# Patient Record
Sex: Female | Born: 1965 | Hispanic: No | Marital: Married | Smoking: Former smoker
Health system: Southern US, Community
[De-identification: ages and names within clinical notes are randomized; demographics above are authoritative.]

## PROBLEM LIST (undated history)

## (undated) DIAGNOSIS — R011 Cardiac murmur, unspecified: Secondary | ICD-10-CM

## (undated) DIAGNOSIS — E785 Hyperlipidemia, unspecified: Secondary | ICD-10-CM

## (undated) HISTORY — DX: Cardiac murmur, unspecified: R01.1

## (undated) HISTORY — DX: Hyperlipidemia, unspecified: E78.5

---

## 2004-05-07 HISTORY — PX: TUBAL LIGATION: SHX77

## 2004-06-28 ENCOUNTER — Inpatient Hospital Stay (HOSPITAL_COMMUNITY): Admission: RE | Admit: 2004-06-28 | Discharge: 2004-07-01 | Payer: Self-pay | Admitting: Gynecology

## 2004-06-28 ENCOUNTER — Encounter (INDEPENDENT_AMBULATORY_CARE_PROVIDER_SITE_OTHER): Payer: Self-pay | Admitting: Specialist

## 2004-08-04 ENCOUNTER — Other Ambulatory Visit: Admission: RE | Admit: 2004-08-04 | Discharge: 2004-08-04 | Payer: Self-pay | Admitting: Gynecology

## 2005-09-05 ENCOUNTER — Other Ambulatory Visit: Admission: RE | Admit: 2005-09-05 | Discharge: 2005-09-05 | Payer: Self-pay | Admitting: Gynecology

## 2006-10-02 ENCOUNTER — Other Ambulatory Visit: Admission: RE | Admit: 2006-10-02 | Discharge: 2006-10-02 | Payer: Self-pay | Admitting: Gynecology

## 2007-11-04 ENCOUNTER — Other Ambulatory Visit: Admission: RE | Admit: 2007-11-04 | Discharge: 2007-11-04 | Payer: Self-pay | Admitting: Gynecology

## 2007-12-12 ENCOUNTER — Encounter: Admission: RE | Admit: 2007-12-12 | Discharge: 2007-12-12 | Payer: Self-pay | Admitting: Gynecology

## 2009-01-18 ENCOUNTER — Other Ambulatory Visit: Admission: RE | Admit: 2009-01-18 | Discharge: 2009-01-18 | Payer: Self-pay | Admitting: Gynecology

## 2009-01-18 ENCOUNTER — Encounter: Payer: Self-pay | Admitting: Gynecology

## 2009-01-18 ENCOUNTER — Ambulatory Visit: Payer: Self-pay | Admitting: Gynecology

## 2010-05-18 ENCOUNTER — Ambulatory Visit
Admission: RE | Admit: 2010-05-18 | Discharge: 2010-05-18 | Payer: Self-pay | Source: Home / Self Care | Attending: Women's Health | Admitting: Women's Health

## 2010-05-18 ENCOUNTER — Other Ambulatory Visit
Admission: RE | Admit: 2010-05-18 | Discharge: 2010-05-18 | Payer: Self-pay | Source: Home / Self Care | Admitting: Gynecology

## 2010-06-22 ENCOUNTER — Other Ambulatory Visit: Payer: Self-pay | Admitting: Gynecology

## 2010-06-22 DIAGNOSIS — Z1231 Encounter for screening mammogram for malignant neoplasm of breast: Secondary | ICD-10-CM

## 2010-06-30 ENCOUNTER — Ambulatory Visit
Admission: RE | Admit: 2010-06-30 | Discharge: 2010-06-30 | Disposition: A | Discharge status: Home / Self Care | Payer: BC Managed Care – PPO | Source: Ambulatory Visit | Attending: Gynecology | Admitting: Gynecology

## 2010-06-30 DIAGNOSIS — Z1231 Encounter for screening mammogram for malignant neoplasm of breast: Secondary | ICD-10-CM

## 2010-09-22 NOTE — H&P (Signed)
[name], [name]             ACCOUNT NO.:  [id number]   MEDICAL RECORD NO.:  [id number]          PATIENT TYPE:  INP   LOCATION:  NA                            FACILITY:  [facility]   PHYSICIAN:  Timothy P. Fontaine, M.D.DATE OF BIRTH:  1965-06-18   DATE OF ADMISSION:  06/28/2004  DATE OF DISCHARGE:                                HISTORY & PHYSICAL   CHIEF. COMPLAINT:  1.  Pregnancy at term.  2.  Prior cesarean section x2 for repeat cesarean section.  3.  Desires permanent sterilization.   HISTORY OF PRESENT ILLNESS:  A 45 year old, G3, P2 female term pregnancy,  two prior cesarean sections for repeat cesarean section, tubal  sterilization.  For the remainder of her history, see her [place].   PHYSICAL EXAMINATION:  HEENT:  Normal.  LUNGS:  Clear.  CARDIAC:  Regular rate. No rubs, murmurs or gallops.  ABDOMEN:  Gravid vertex, positive fetal heart tones.  PELVIC:  Deferred.   ASSESSMENT/PLAN:  A 45 year old, G3, P2 female term pregnancy and prior  cesarean section x2 for repeat cesarean section and tubal sterilization. The  risks, benefits, indications and alternatives were reviewed with her to  include the permanency of sterilization as well as the risk of failure.  The  risk of bleeding, transfusion, infection, wound complications requiring  opening and draining of incisions, closure by secondary intention,  inadvertent injury to internal organs including bowel, bladder, ureters,  vessels and nerves necessitating major reparative surgeries and future  reparative surgeries as well as fetal injury during the birthing process to  include musculoskeletal, neural scapel were all discussed, understood and  accepted.  The patient's questions were answered. She is ready to proceed  with the surgery.      TPF/MEDQ  D:  06/28/2004  T:  06/28/2004  Job:  [id number]

## 2010-09-22 NOTE — Op Note (Signed)
[name], [name]             ACCOUNT NO.:  [id number]   MEDICAL RECORD NO.:  [id number]          PATIENT TYPE:  INP   LOCATION:  9128                          [facility]ACILITY:  [facility]   PHYSICIAN:  Timothy P. Fontaine, M.D.DATE OF BIRTH:  12-Oct-1965   DATE OF PROCEDURE:  06/28/2004  DATE OF DISCHARGE:                                 OPERATIVE REPORT   PREOPERATIVE DIAGNOSIS:  Pregnancy at term, prior cesarean section x2, for  repeat cesarean section.  Desires permanent sterilization.   POSTOPERATIVE DIAGNOSIS:  Pregnancy at term, prior cesarean section x2, for  repeat cesarean section.  Desires permanent sterilization.   PROCEDURE:  Repeat low transverse cesarean section, bilateral tubal  sterilization, modified Pomeroy technique.  Scar revision.   SURGEON:  Timothy P. Fontaine, M.D.   ASSISTANTGaetano Hawthorne. Lily Peer, M.D.   ANESTHESIA:  Spinal.   ESTIMATED BLOOD LOSS:  Less than 500 mL.   COMPLICATIONS:  None.   SPECIMENS:  Samples of cord blood.  Portions of right and left fallopian  tubes.   FINDINGS:  At 36 normal female infant, Apgars 9 and 9, weight 7 pounds 14  ounces.  Pelvic anatomy noted to be normal.   DESCRIPTION OF PROCEDURE:  The patient was taken to the operating room and  underwent spinal anesthesia and was placed in the left tilt supine position  and received an abdominal preparation with Betadine solution.  Bladder  emptied with an indwelling Foley catheterization placed in sterile technique  per nursing personnel.  The patient was draped in the usual fashion and  after assuring adequate anesthesia, the abdomen was sharply entered through  a repeat Pfannenstiel incision achieving adequate hemostasis at all levels.  The scar was excised upon entry into the abdomen.  The bladder flap was  sharply and bluntly developed without difficulty.  The lower uterine segment  was noted to be translucent and thin.  An incision was made, the membranes  were ruptured, and  the fluid noted to be clear. The incision was then  extended laterally bluntly without difficulty. The infant's head was then  delivered through the incision and the nares and mouth suctioned.  The rest  of the infant delivered.  The cord doubly clamped and cut and the infant  handed to pediatrics in attendance.  Samples of cord blood were obtained.  The placenta was spontaneously extruded and noted to be intact.  The uterus  was exteriorized.  The endometrial cavity explored with a sponge to remove  all placental and membrane fragments.  The uterine incision was then closed  in one layer using 0 Vicryl suture in a running interlocking stitch.  Several figure-of-eight sutures were placed to achieve ultimate hemostasis.  The patient received 1 gram of Cefazolin antibiotic prophylaxis.  The left  fallopian tube was then identified and traced from its insertion to its  fimbriated end and a mid tubal segment was doubly ligated using 0 plain  suture and the intervening segment was sharply excised.  Tubal lumen as well  as adequate hemostasis was grossly visualized.  A similar procedure was  carried out on the  other side.  The uterus was then returned to the abdomen  which was copiously irrigated.  The anterior fascia was reapproximated using  0 Vicryl suture in a running stitch starting at the angle and meeting in the  middle.  The subcutaneous tissues were irrigated.  Adequate hemostasis  achieved with electrocautery.  The skin reapproximated using 4-0 Vicryl in a  running subcuticular stitch.  Steri-Strips and Benzoin applied.  Sterile  dressing applied.  The patient was taken to the recovery room in good  condition having tolerated the procedure well.      TPF/MEDQ  D:  06/28/2004  T:  06/28/2004  Job:  [id number]

## 2010-09-22 NOTE — Discharge Summary (Signed)
[name], [name]             ACCOUNT NO.:  [id number]   MEDICAL RECORD NO.:  [id number]          PATIENT TYPE:  INP   LOCATION:  9128                          [facility]ILITY:  [facility]   PHYSICIAN:  Timothy P. Fontaine, M.D.DATE OF BIRTH:  10-Jul-1965   DATE OF ADMISSION:  06/28/2004  DATE OF DISCHARGE:  07/01/2004                                 DISCHARGE SUMMARY   DISCHARGE DIAGNOSES:  1.  Pregnancy at term.  2.  Prior cesarean section for repeat cesarean section.  3.  Desires permanent sterilization.   PROCEDURE:  Repeat low transverse cervical cesarean section and bilateral  tubal sterilization on June 28, 2004, by Dr. Colin Broach.   HOSPITAL COURSE:  A 45 year old gravida 3, para 2 female term gestation,  prior cesarean section x2, for repeat cesarean section, tubal sterilization.  The patient underwent repeat low transverse cervical cesarean section and  bilateral tubal sterilization, scar revision June 28, 2004, without  complications producing a normal female infant, weight 7 pounds 14 ounces,  Apgars 9 and 9 at 0917. Pelvic anatomy was noted to be normal. The patient's  postoperative course was uncomplicated. She was discharged on postoperative  day #3 ambulating well, tolerating a regular diet. Postoperative hemoglobin  9.9, blood type O+, rubella titer positive. She received precautions,  instructions and follow-up. Prescription for Tylox #25 one to two p.o. q.4-6  hours p.r.n. pain and will be seen in the office in six weeks for post  partum checkup.      TPF/MEDQ  D:  07/01/2004  T:  07/03/2004  Job:  [id number]

## 2011-07-19 ENCOUNTER — Encounter: Payer: BC Managed Care – PPO | Admitting: Women's Health

## 2011-07-26 ENCOUNTER — Encounter: Payer: BC Managed Care – PPO | Admitting: Women's Health

## 2011-08-02 ENCOUNTER — Encounter: Payer: BC Managed Care – PPO | Admitting: Women's Health

## 2012-05-05 ENCOUNTER — Encounter: Payer: Self-pay | Admitting: Women's Health

## 2012-05-05 ENCOUNTER — Other Ambulatory Visit (HOSPITAL_COMMUNITY)
Admission: RE | Admit: 2012-05-05 | Discharge: 2012-05-05 | Disposition: A | Discharge status: Home / Self Care | Payer: BC Managed Care – PPO | Source: Ambulatory Visit | Attending: Women's Health | Admitting: Women's Health

## 2012-05-05 ENCOUNTER — Ambulatory Visit (INDEPENDENT_AMBULATORY_CARE_PROVIDER_SITE_OTHER): Payer: BC Managed Care – PPO | Admitting: Women's Health

## 2012-05-05 VITALS — BP 112/70 | Ht 63.5 in | Wt 135.0 lb

## 2012-05-05 DIAGNOSIS — Z01419 Encounter for gynecological examination (general) (routine) without abnormal findings: Secondary | ICD-10-CM

## 2012-05-05 DIAGNOSIS — Z1151 Encounter for screening for human papillomavirus (HPV): Secondary | ICD-10-CM | POA: Insufficient documentation

## 2012-05-05 LAB — CBC WITH DIFFERENTIAL/PLATELET
Basophils Absolute: 0 10*3/uL (ref 0.0–0.1)
Eosinophils Relative: 3 % (ref 0–5)
HCT: 40.8 % (ref 36.0–46.0)
Lymphocytes Relative: 41 % (ref 12–46)
Lymphs Abs: 2.3 10*3/uL (ref 0.7–4.0)
MCV: 90.5 fL (ref 78.0–100.0)
Monocytes Absolute: 0.5 10*3/uL (ref 0.1–1.0)
Neutro Abs: 2.7 10*3/uL (ref 1.7–7.7)
RBC: 4.51 MIL/uL (ref 3.87–5.11)
RDW: 12.9 % (ref 11.5–15.5)
WBC: 5.6 10*3/uL (ref 4.0–10.5)

## 2012-05-05 NOTE — Patient Instructions (Addendum)

## 2012-05-05 NOTE — Progress Notes (Signed)
COREENA RUBALCAVA 11-Aug-1965 [id number]    History:    The patient presents for annual exam.  Monthly  light cycle/BTL. History of a normal mammograms and Paps.  Past medical history, past surgical history, family history and social history were all reviewed and documented in the EPIC chart. Works in Designer, television/film set. Built a new home last year.  Hank 11, Macy 9 and Eli 8, all doing well.   ROS:  A  ROS was performed and pertinent positives and negatives are included in the history.  Exam:  Filed Vitals:   05/05/12 1423  BP: 112/70    General appearance:  Normal Head/Neck:  Normal, without cervical or supraclavicular adenopathy. Thyroid:  Symmetrical, normal in size, without palpable masses or nodularity. Respiratory  Effort:  Normal  Auscultation:  Clear without wheezing or rhonchi Cardiovascular  Auscultation:  Regular rate, without rubs, murmurs or gallops  Edema/varicosities:  Not grossly evident Abdominal  Soft,nontender, without masses, guarding or rebound.  Liver/spleen:  No organomegaly noted  Hernia:  None appreciated  Skin  Inspection:  Grossly normal  Palpation:  Grossly normal Neurologic/psychiatric  Orientation:  Normal with appropriate conversation.  Mood/affect:  Normal  Genitourinary    Breasts: Examined lying and sitting.     Right: Without masses, retractions, discharge or axillary adenopathy.     Left: Without masses, retractions, discharge or axillary adenopathy.   Inguinal/mons:  Normal without inguinal adenopathy  External genitalia:  Normal  BUS/Urethra/Skene's glands:  Normal  Bladder:  Normal  Vagina:  Normal  Cervix:  Normal  Uterus:   normal in size, shape and contour.  Midline and mobile  Adnexa/parametria:     Rt: Without masses or tenderness.   Lt: Without masses or tenderness.  Anus and perineum: Normal  Digital rectal exam: Normal sphincter tone without palpated masses or tenderness  Assessment/Plan:  46 y.o. M. WF G3 P3 for  annual exam with no complaints.  Normal GYN exam/BTL  Plan: SBE's, overdue for mammogram, instructed to schedule, 3-D tomography reviewed history of dense breast. Encouraged to increase regular exercise, calcium rich diet, vitamin D 1000 daily. CBC, UA, Pap. Normal Pap January 2012. New screening guidelines reviewed. Excellent lipid panel 2012.   KRYSLYN, HELBIG [facility]NP, 3:10 PM 05/05/2012

## 2012-05-05 NOTE — Addendum Note (Signed)
Addended by: Leonard Schwartz A on: 05/05/2012 04:39 PM   Modules accepted: Orders

## 2012-05-06 LAB — URINALYSIS W MICROSCOPIC + REFLEX CULTURE
Bilirubin Urine: NEGATIVE
Casts: NONE SEEN
Crystals: NONE SEEN
Ketones, ur: NEGATIVE mg/dL
Nitrite: NEGATIVE
Specific Gravity, Urine: 1.008 (ref 1.005–1.030)
Squamous Epithelial / LPF: NONE SEEN
Urobilinogen, UA: 0.2 mg/dL (ref 0.0–1.0)
pH: 6 (ref 5.0–8.0)

## 2012-05-14 ENCOUNTER — Other Ambulatory Visit: Payer: Self-pay | Admitting: Women's Health

## 2012-05-14 DIAGNOSIS — R7881 Bacteremia: Secondary | ICD-10-CM

## 2012-05-23 ENCOUNTER — Other Ambulatory Visit: Payer: BC Managed Care – PPO

## 2012-05-23 DIAGNOSIS — R7881 Bacteremia: Secondary | ICD-10-CM

## 2012-05-24 LAB — URINALYSIS W MICROSCOPIC + REFLEX CULTURE
Bilirubin Urine: NEGATIVE
Casts: NONE SEEN
Crystals: NONE SEEN
Glucose, UA: NEGATIVE mg/dL
Leukocytes, UA: NEGATIVE
pH: 6 (ref 5.0–8.0)

## 2012-06-11 ENCOUNTER — Other Ambulatory Visit: Payer: Self-pay | Admitting: Gynecology

## 2012-06-11 DIAGNOSIS — Z1231 Encounter for screening mammogram for malignant neoplasm of breast: Secondary | ICD-10-CM

## 2012-07-09 ENCOUNTER — Ambulatory Visit: Payer: BC Managed Care – PPO

## 2012-07-17 ENCOUNTER — Ambulatory Visit
Admission: RE | Admit: 2012-07-17 | Discharge: 2012-07-17 | Disposition: A | Discharge status: Home / Self Care | Payer: BC Managed Care – PPO | Source: Ambulatory Visit | Attending: Gynecology | Admitting: Gynecology

## 2012-07-17 DIAGNOSIS — Z1231 Encounter for screening mammogram for malignant neoplasm of breast: Secondary | ICD-10-CM

## 2013-06-05 ENCOUNTER — Encounter: Payer: Self-pay | Admitting: Women's Health

## 2013-06-05 ENCOUNTER — Other Ambulatory Visit (HOSPITAL_COMMUNITY)
Admission: RE | Admit: 2013-06-05 | Discharge: 2013-06-05 | Disposition: A | Discharge status: Home / Self Care | Payer: BC Managed Care – PPO | Source: Ambulatory Visit | Attending: Gynecology | Admitting: Gynecology

## 2013-06-05 ENCOUNTER — Ambulatory Visit (INDEPENDENT_AMBULATORY_CARE_PROVIDER_SITE_OTHER): Payer: BC Managed Care – PPO | Admitting: Women's Health

## 2013-06-05 VITALS — BP 112/74 | Ht 63.5 in | Wt 133.0 lb

## 2013-06-05 DIAGNOSIS — Z01419 Encounter for gynecological examination (general) (routine) without abnormal findings: Secondary | ICD-10-CM

## 2013-06-05 DIAGNOSIS — N951 Menopausal and female climacteric states: Secondary | ICD-10-CM

## 2013-06-05 DIAGNOSIS — Z13220 Encounter for screening for lipoid disorders: Secondary | ICD-10-CM

## 2013-06-05 LAB — CBC WITH DIFFERENTIAL/PLATELET
BASOS ABS: 0 10*3/uL (ref 0.0–0.1)
Basophils Relative: 0 % (ref 0–1)
Eosinophils Absolute: 0.1 10*3/uL (ref 0.0–0.7)
Eosinophils Relative: 2 % (ref 0–5)
HEMATOCRIT: 39.9 % (ref 36.0–46.0)
HEMOGLOBIN: 13.3 g/dL (ref 12.0–15.0)
LYMPHS PCT: 38 % (ref 12–46)
Lymphs Abs: 2.1 10*3/uL (ref 0.7–4.0)
MCH: 30.7 pg (ref 26.0–34.0)
MCHC: 33.3 g/dL (ref 30.0–36.0)
MCV: 92.1 fL (ref 78.0–100.0)
MONO ABS: 0.4 10*3/uL (ref 0.1–1.0)
MONOS PCT: 7 % (ref 3–12)
NEUTROS ABS: 3 10*3/uL (ref 1.7–7.7)
NEUTROS PCT: 53 % (ref 43–77)
Platelets: 312 10*3/uL (ref 150–400)
RBC: 4.33 MIL/uL (ref 3.87–5.11)
RDW: 13 % (ref 11.5–15.5)
WBC: 5.6 10*3/uL (ref 4.0–10.5)

## 2013-06-05 LAB — LIPID PANEL
CHOLESTEROL: 173 mg/dL (ref 0–200)
HDL: 64 mg/dL (ref >39)
LDL CALC: 92 mg/dL (ref 0–99)
TRIGLYCERIDES: 85 mg/dL (ref <150)
Total CHOL/HDL Ratio: 2.7 Ratio
VLDL: 17 mg/dL (ref 0–40)

## 2013-06-05 LAB — FOLLICLE STIMULATING HORMONE: FSH: 78.7 m[IU]/mL

## 2013-06-05 LAB — TSH: TSH: 0.864 u[IU]/mL (ref 0.350–4.500)

## 2013-06-05 NOTE — Addendum Note (Signed)
Addended by: Alen Blew on: 06/05/2013 05:02 PM   Modules accepted: Orders

## 2013-06-05 NOTE — Progress Notes (Signed)
[name] [birth year] [id number]    History:    Presents for annual exam.  Cycles mostly monthly, less predictable with occasional hot flushes/BTL. History of normal Paps and mammograms.  Past medical history, past surgical history, family history and social history were all reviewed and documented in the EPIC chart. 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 [name] 12, [name] 10, [name] 11, all doing well. Father died of pancreatic cancer. Sister hypertension. Mother well.   ROS:  A  ROS was performed and pertinent positives and negatives are included.  Exam:  Filed Vitals:   06/05/13 1135  BP: 112/74    General appearance:  Normal Thyroid:  Symmetrical, normal in size, without palpable masses or nodularity. Respiratory  Auscultation:  Clear without wheezing or rhonchi Cardiovascular  Auscultation:  Regular rate, without rubs, murmurs or gallops  Edema/varicosities:  Not grossly evident Abdominal  Soft,nontender, without masses, guarding or rebound.  Liver/spleen:  No organomegaly noted  Hernia:  None appreciated  Skin  Inspection:  Grossly normal   Breasts: Examined lying and sitting.     Right: Without masses, retractions, discharge or axillary adenopathy.     Left: Without masses, retractions, discharge or axillary adenopathy. Gentitourinary   Inguinal/mons:  Normal without inguinal adenopathy  External genitalia:  Normal  BUS/Urethra/Skene's glands:  Normal  Vagina:  Normal  Cervix:  Normal  Uterus:   normal in size, shape and contour.  Midline and mobile  Adnexa/parametria:     Rt: Without masses or tenderness.   Lt: Without masses or tenderness.  Anus and perineum: Normal  Digital rectal exam: Normal sphincter tone without palpated masses or tenderness  Assessment/Plan:  47 y.o.MWF G3P3  for annual exam with complaints of occasional hot flushes and cycles becoming less predictable..    Perimenopausal/BTL  Plan: SBE's, continue  annual mammogram, 3-D tomography reviewed and encouraged has history of dense breast. Continue regular exercise, healthy diet, vitamin D 1000 daily encouraged. CBC, TSH, lipid panel, FSH, UA, Pap. Pap normal 05/2010. New screening guidelines reviewed. Instructed to call if cycles space greater than 3 months.    [name], [name] [facility], 1:59 PM 06/05/2013

## 2013-06-05 NOTE — Patient Instructions (Signed)

## 2013-06-06 LAB — URINALYSIS W MICROSCOPIC + REFLEX CULTURE
BACTERIA UA: NONE SEEN
BILIRUBIN URINE: NEGATIVE
CASTS: NONE SEEN
CRYSTALS: NONE SEEN
Glucose, UA: NEGATIVE mg/dL
HGB URINE DIPSTICK: NEGATIVE
KETONES UR: NEGATIVE mg/dL
Leukocytes, UA: NEGATIVE
NITRITE: NEGATIVE
PH: 6 (ref 5.0–8.0)
Protein, ur: NEGATIVE mg/dL
Specific Gravity, Urine: 1.015 (ref 1.005–1.030)
Squamous Epithelial / LPF: NONE SEEN
Urobilinogen, UA: 0.2 mg/dL (ref 0.0–1.0)

## 2013-07-10 ENCOUNTER — Telehealth: Payer: Self-pay | Admitting: *Deleted

## 2013-07-10 NOTE — Telephone Encounter (Signed)
Message left, [facility] 80 05/2013 was having irregular cycles)

## 2013-07-10 NOTE — Telephone Encounter (Signed)
Pt was seen on OV 06/05/13 for annual told to call if any spotting or bleeding should occur due to elevated [place]. Pt started spotting this am, c/o cramping, bloating, no energy. Please advise

## 2013-07-17 NOTE — Telephone Encounter (Signed)
Telephone call, states had a 3 day light cycle, will continue to monitor, has not been one year without cycles. 47, elevated FSH.

## 2014-02-19 ENCOUNTER — Other Ambulatory Visit: Payer: Self-pay

## 2014-03-08 ENCOUNTER — Encounter: Payer: Self-pay | Admitting: Women's Health

## 2014-08-18 ENCOUNTER — Encounter: Payer: Self-pay | Admitting: Women's Health

## 2014-08-18 ENCOUNTER — Ambulatory Visit (INDEPENDENT_AMBULATORY_CARE_PROVIDER_SITE_OTHER): Payer: BLUE CROSS/BLUE SHIELD | Admitting: Women's Health

## 2014-08-18 VITALS — BP 118/80 | Ht 63.0 in | Wt 132.0 lb

## 2014-08-18 DIAGNOSIS — Z01419 Encounter for gynecological examination (general) (routine) without abnormal findings: Secondary | ICD-10-CM | POA: Diagnosis not present

## 2014-08-18 DIAGNOSIS — Z833 Family history of diabetes mellitus: Secondary | ICD-10-CM | POA: Diagnosis not present

## 2014-08-18 LAB — CBC WITH DIFFERENTIAL/PLATELET
Basophils Absolute: 0 10*3/uL (ref 0.0–0.1)
Basophils Relative: 0 % (ref 0–1)
EOS ABS: 0.1 10*3/uL (ref 0.0–0.7)
EOS PCT: 2 % (ref 0–5)
HCT: 40.8 % (ref 36.0–46.0)
Hemoglobin: 13.6 g/dL (ref 12.0–15.0)
LYMPHS ABS: 2.5 10*3/uL (ref 0.7–4.0)
LYMPHS PCT: 41 % (ref 12–46)
MCH: 30.9 pg (ref 26.0–34.0)
MCHC: 33.3 g/dL (ref 30.0–36.0)
MCV: 92.7 fL (ref 78.0–100.0)
MONOS PCT: 7 % (ref 3–12)
MPV: 9.3 fL (ref 8.6–12.4)
Monocytes Absolute: 0.4 10*3/uL (ref 0.1–1.0)
Neutro Abs: 3.1 10*3/uL (ref 1.7–7.7)
Neutrophils Relative %: 50 % (ref 43–77)
Platelets: 317 10*3/uL (ref 150–400)
RBC: 4.4 MIL/uL (ref 3.87–5.11)
RDW: 12.8 % (ref 11.5–15.5)
WBC: 6.2 10*3/uL (ref 4.0–10.5)

## 2014-08-18 NOTE — Patient Instructions (Signed)

## 2014-08-18 NOTE — Progress Notes (Signed)
[name] February 01, 1966 [id number]    History:    Presents for annual exam.  Irregular cycles, cycles every 2-3 months in a row and then will skip 2 months, had an elevated FSH in the past states has numerous hot flashes when amenorrheic. Normal Pap and mammogram history, overdue for mammogram.  Past medical history, past surgical history, family history and social history were all reviewed and documented in the EPIC chart. 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 3 children ages 56, 45 and 58. Sister hypertension, mother healthy.  ROS:  A ROS was performed and pertinent positives and negatives are included.  Exam:  Filed Vitals:   08/18/14 1525  BP: 118/80    General appearance:  Normal Thyroid:  Symmetrical, normal in size, without palpable masses or nodularity. Respiratory  Auscultation:  Clear without wheezing or rhonchi Cardiovascular  Auscultation:  Regular rate, without rubs, murmurs or gallops  Edema/varicosities:  Not grossly evident Abdominal  Soft,nontender, without masses, guarding or rebound.  Liver/spleen:  No organomegaly noted  Hernia:  None appreciated  Skin  Inspection:  Grossly normal   Breasts: Examined lying and sitting.     Right: Without masses, retractions, discharge or axillary adenopathy.     Left: Without masses, retractions, discharge or axillary adenopathy. Gentitourinary   Inguinal/mons:  Normal without inguinal adenopathy  External genitalia:  Normal  BUS/Urethra/Skene's glands:  Normal  Vagina:  Normal  Cervix:  Normal  Uterus:  normal in size, shape and contour.  Midline and mobile  Adnexa/parametria:     Rt: Without masses or tenderness.   Lt: Without masses or tenderness.  Anus and perineum: Normal  Digital rectal exam: Normal sphincter tone without palpated masses or tenderness  Assessment/Plan:  49 y.o. MWF G3 P3 for annual exam with no complaints.    Perimenopausal/irregular cycles/BTL  Plan: Keep  menstrual record, if cycles space greater than 3 months return to office, menopause reviewed.  SBEs, reviewed importance of annual screening mammogram, 3-D tomography reviewed and encouraged history of dense breast overdue instructed to schedule. Continue active lifestyle, encouraged regular exercise, calcium rich diet, vitamin D 1000 daily encouraged. CBC, glucose, (Normal lipid panel 2015), UA, Pap normal 2015, new screening guidelines reviewed.    JAICE, LAGUE [facility], 4:01 PM 08/18/2014

## 2014-08-19 LAB — URINALYSIS W MICROSCOPIC + REFLEX CULTURE
BACTERIA UA: NONE SEEN
Bilirubin Urine: NEGATIVE
Casts: NONE SEEN
Crystals: NONE SEEN
GLUCOSE, UA: NEGATIVE mg/dL
HGB URINE DIPSTICK: NEGATIVE
KETONES UR: NEGATIVE mg/dL
LEUKOCYTES UA: NEGATIVE
NITRITE: NEGATIVE
PROTEIN: NEGATIVE mg/dL
Specific Gravity, Urine: 1.017 (ref 1.005–1.030)
Squamous Epithelial / LPF: NONE SEEN
Urobilinogen, UA: 0.2 mg/dL (ref 0.0–1.0)
pH: 6.5 (ref 5.0–8.0)

## 2014-08-19 LAB — GLUCOSE, RANDOM: GLUCOSE: 87 mg/dL (ref 70–99)

## 2015-07-05 ENCOUNTER — Other Ambulatory Visit: Payer: Self-pay

## 2015-07-05 DIAGNOSIS — Z1231 Encounter for screening mammogram for malignant neoplasm of breast: Secondary | ICD-10-CM

## 2015-07-07 ENCOUNTER — Ambulatory Visit: Payer: Self-pay

## 2015-07-18 ENCOUNTER — Ambulatory Visit
Admission: RE | Admit: 2015-07-18 | Discharge: 2015-07-18 | Disposition: A | Discharge status: Home / Self Care | Payer: BLUE CROSS/BLUE SHIELD | Source: Ambulatory Visit

## 2015-07-18 DIAGNOSIS — Z1231 Encounter for screening mammogram for malignant neoplasm of breast: Secondary | ICD-10-CM

## 2015-08-23 ENCOUNTER — Encounter: Payer: Self-pay | Admitting: Women's Health

## 2015-08-23 ENCOUNTER — Ambulatory Visit (INDEPENDENT_AMBULATORY_CARE_PROVIDER_SITE_OTHER): Payer: BLUE CROSS/BLUE SHIELD | Admitting: Women's Health

## 2015-08-23 VITALS — BP 118/78 | Ht 63.0 in | Wt 131.0 lb

## 2015-08-23 DIAGNOSIS — Z01419 Encounter for gynecological examination (general) (routine) without abnormal findings: Secondary | ICD-10-CM | POA: Diagnosis not present

## 2015-08-23 DIAGNOSIS — Z13220 Encounter for screening for lipoid disorders: Secondary | ICD-10-CM | POA: Diagnosis not present

## 2015-08-23 LAB — CBC WITH DIFFERENTIAL/PLATELET
BASOS PCT: 0 %
Basophils Absolute: 0 cells/uL (ref 0–200)
EOS ABS: 62 {cells}/uL (ref 15–500)
Eosinophils Relative: 1 %
HEMATOCRIT: 40.8 % (ref 35.0–45.0)
Hemoglobin: 13.6 g/dL (ref 11.7–15.5)
LYMPHS ABS: 2046 {cells}/uL (ref 850–3900)
Lymphocytes Relative: 33 %
MCH: 31.1 pg (ref 27.0–33.0)
MCHC: 33.3 g/dL (ref 32.0–36.0)
MCV: 93.4 fL (ref 80.0–100.0)
MONO ABS: 434 {cells}/uL (ref 200–950)
MPV: 9.3 fL (ref 7.5–12.5)
Monocytes Relative: 7 %
NEUTROS ABS: 3658 {cells}/uL (ref 1500–7800)
Neutrophils Relative %: 59 %
Platelets: 308 10*3/uL (ref 140–400)
RBC: 4.37 MIL/uL (ref 3.80–5.10)
RDW: 13 % (ref 11.0–15.0)
WBC: 6.2 10*3/uL (ref 3.8–10.8)

## 2015-08-23 LAB — COMPREHENSIVE METABOLIC PANEL
ALK PHOS: 46 U/L (ref 33–115)
ALT: 13 U/L (ref 6–29)
AST: 22 U/L (ref 10–35)
Albumin: 4.8 g/dL (ref 3.6–5.1)
BILIRUBIN TOTAL: 0.7 mg/dL (ref 0.2–1.2)
BUN: 15 mg/dL (ref 7–25)
CALCIUM: 10.1 mg/dL (ref 8.6–10.2)
CO2: 27 mmol/L (ref 20–31)
Chloride: 100 mmol/L (ref 98–110)
Creat: 0.81 mg/dL (ref 0.50–1.10)
GLUCOSE: 94 mg/dL (ref 65–99)
POTASSIUM: 4.3 mmol/L (ref 3.5–5.3)
Sodium: 140 mmol/L (ref 135–146)
TOTAL PROTEIN: 7.1 g/dL (ref 6.1–8.1)

## 2015-08-23 LAB — LIPID PANEL
CHOL/HDL RATIO: 2.7 ratio (ref <=5.0)
CHOLESTEROL: 208 mg/dL — AB (ref 125–200)
HDL: 76 mg/dL (ref >=46)
LDL Cholesterol: 116 mg/dL (ref <130)
Triglycerides: 80 mg/dL (ref <150)
VLDL: 16 mg/dL (ref <30)

## 2015-08-23 NOTE — Patient Instructions (Signed)

## 2015-08-23 NOTE — Progress Notes (Signed)
RIGHTEOUS BOUCHIE 1965/06/06 [id number]    History:    Presents for annual exam.  Continues to have cycles every 2-3 months, has had an elevated FSH but has not gone longer than 3 months without a cycle. Increased hot flushes when amenorrheic. BTL. Normal Pap and mammogram history.  Past medical history, past surgical history, family history and social history were all reviewed and documented in the EPIC chart. 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 Children are 14, 12 and 11 all doing well. Mother struggling with Alzheimer's.  ROS:  A ROS was performed and pertinent positives and negatives are included.  Exam:  Filed Vitals:   08/23/15 1106  BP: 118/78    General appearance:  Normal Thyroid:  Symmetrical, normal in size, without palpable masses or nodularity. Respiratory  Auscultation:  Clear without wheezing or rhonchi Cardiovascular  Auscultation:  Regular rate, without rubs, murmurs or gallops  Edema/varicosities:  Not grossly evident Abdominal  Soft,nontender, without masses, guarding or rebound.  Liver/spleen:  No organomegaly noted  Hernia:  None appreciated  Skin  Inspection:  Grossly normal   Breasts: Examined lying and sitting.     Right: Without masses, retractions, discharge or axillary adenopathy.     Left: Without masses, retractions, discharge or axillary adenopathy. Gentitourinary   Inguinal/mons:  Normal without inguinal adenopathy  External genitalia:  Normal  BUS/Urethra/Skene's glands:  Normal  Vagina:  Normal  Cervix:  Normal  Uterus:   normal in size, shape and contour.  Midline and mobile  Adnexa/parametria:     Rt: Without masses or tenderness.   Lt: Without masses or tenderness.  Anus and perineum: Normal  Digital rectal exam: Normal sphincter tone without palpated masses or tenderness  Assessment/Plan:  50 y.o. MWF G3 P3 for annual exam with no complaints.  Perimenopausal/cycles  every 2-3 months/BTL  Plan: SBE's, annual 3-D screening mammogram encouraged history of dense breasts. Continue active lifestyle of exercise, healthy diet and calcium rich foods. Gardasil reviewed and encouraged for children. CBC, CMP, lipid panel, UA, Pap with HR HPV typing, new screening guidelines reviewed. Instructed to call if cycles space greater than 3 months.  [place], 1:08 PM 08/23/2015

## 2015-08-24 ENCOUNTER — Encounter: Payer: Self-pay | Admitting: Women's Health

## 2015-08-24 LAB — URINALYSIS W MICROSCOPIC + REFLEX CULTURE
Bilirubin Urine: NEGATIVE
CASTS: NONE SEEN [LPF]
GLUCOSE, UA: NEGATIVE
HGB URINE DIPSTICK: NEGATIVE
KETONES UR: NEGATIVE
LEUKOCYTES UA: NEGATIVE
Nitrite: NEGATIVE
PROTEIN: NEGATIVE
Specific Gravity, Urine: 1.03 (ref 1.001–1.035)
YEAST: NONE SEEN [HPF]
pH: 6 (ref 5.0–8.0)

## 2015-08-25 LAB — URINE CULTURE
COLONY COUNT: NO GROWTH
ORGANISM ID, BACTERIA: NO GROWTH

## 2015-08-26 LAB — PAP, TP IMAGING W/ HPV RNA, RFLX HPV TYPE 16,18/45: HPV mRNA, High Risk: NOT DETECTED

## 2016-03-28 ENCOUNTER — Other Ambulatory Visit: Payer: BLUE CROSS/BLUE SHIELD | Admitting: Family

## 2016-09-10 ENCOUNTER — Ambulatory Visit (INDEPENDENT_AMBULATORY_CARE_PROVIDER_SITE_OTHER): Payer: PRIVATE HEALTH INSURANCE | Admitting: Women's Health

## 2016-09-10 ENCOUNTER — Encounter: Payer: Self-pay | Admitting: Women's Health

## 2016-09-10 VITALS — BP 124/80 | Ht 63.0 in | Wt 132.0 lb

## 2016-09-10 DIAGNOSIS — Z13220 Encounter for screening for lipoid disorders: Secondary | ICD-10-CM

## 2016-09-10 DIAGNOSIS — Z01419 Encounter for gynecological examination (general) (routine) without abnormal findings: Secondary | ICD-10-CM

## 2016-09-10 LAB — CBC WITH DIFFERENTIAL/PLATELET
BASOS PCT: 0 %
Basophils Absolute: 0 cells/uL (ref 0–200)
EOS PCT: 2 %
Eosinophils Absolute: 112 cells/uL (ref 15–500)
HCT: 42.1 % (ref 35.0–45.0)
Hemoglobin: 13.9 g/dL (ref 11.7–15.5)
LYMPHS PCT: 38 %
Lymphs Abs: 2128 cells/uL (ref 850–3900)
MCH: 31 pg (ref 27.0–33.0)
MCHC: 33 g/dL (ref 32.0–36.0)
MCV: 94 fL (ref 80.0–100.0)
MONOS PCT: 9 %
MPV: 9.5 fL (ref 7.5–12.5)
Monocytes Absolute: 504 cells/uL (ref 200–950)
NEUTROS PCT: 51 %
Neutro Abs: 2856 cells/uL (ref 1500–7800)
PLATELETS: 310 10*3/uL (ref 140–400)
RBC: 4.48 MIL/uL (ref 3.80–5.10)
RDW: 12.8 % (ref 11.0–15.0)
WBC: 5.6 10*3/uL (ref 3.8–10.8)

## 2016-09-10 LAB — COMPREHENSIVE METABOLIC PANEL
ALT: 11 U/L (ref 6–29)
AST: 21 U/L (ref 10–35)
Albumin: 4.6 g/dL (ref 3.6–5.1)
Alkaline Phosphatase: 47 U/L (ref 33–115)
BILIRUBIN TOTAL: 0.4 mg/dL (ref 0.2–1.2)
BUN: 17 mg/dL (ref 7–25)
CALCIUM: 10.3 mg/dL — AB (ref 8.6–10.2)
CO2: 23 mmol/L (ref 20–31)
CREATININE: 0.77 mg/dL (ref 0.50–1.10)
Chloride: 104 mmol/L (ref 98–110)
GLUCOSE: 86 mg/dL (ref 65–99)
Potassium: 4.7 mmol/L (ref 3.5–5.3)
SODIUM: 140 mmol/L (ref 135–146)
Total Protein: 6.9 g/dL (ref 6.1–8.1)

## 2016-09-10 LAB — LIPID PANEL
CHOL/HDL RATIO: 2.7 ratio (ref <5.0)
Cholesterol: 180 mg/dL (ref <200)
HDL: 66 mg/dL (ref >50)
LDL Cholesterol: 99 mg/dL (ref <100)
Triglycerides: 77 mg/dL (ref <150)
VLDL: 15 mg/dL (ref <30)

## 2016-09-10 NOTE — Progress Notes (Signed)
[name] [birth year] [id number]  History:    Presents for annual exam.  Cycles every 2-6 months for the past year/BTL with occasional menopausal symptoms. Normal Pap and mammogram history.  Past medical history, past surgical history, family history and social history were all reviewed and documented in the EPIC chart. 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 Mother Alzheimer's now has 24 hour care at home.  ROS:  A ROS was performed and pertinent positives and negatives are included.  Exam:  Vitals:   09/10/16 0928  BP: 124/80  Weight: 132 lb (59.9 kg)  Height: 5\' 3"  (1.6 m)   Body mass index is 23.38 kg/m.   General appearance:  Normal Thyroid:  Symmetrical, normal in size, without palpable masses or nodularity. Respiratory  Auscultation:  Clear without wheezing or rhonchi Cardiovascular  Auscultation:  Regular rate, without rubs, murmurs or gallops  Edema/varicosities:  Not grossly evident Abdominal  Soft,nontender, without masses, guarding or rebound.  Liver/spleen:  No organomegaly noted  Hernia:  None appreciated  Skin  Inspection:  Grossly normal   Breasts: Examined lying and sitting.     Right: Without masses, retractions, discharge or axillary adenopathy.     Left: Without masses, retractions, discharge or axillary adenopathy. Gentitourinary   Inguinal/mons:  Normal without inguinal adenopathy  External genitalia:  Normal  BUS/Urethra/Skene's glands:  Normal  Vagina:  Normal  Cervix:  Normal  Uterus:   normal in size, shape and contour.  Midline and mobile  Adnexa/parametria:     Rt: Without masses or tenderness.   Lt: Without masses or tenderness.  Anus and perineum: Normal  Digital rectal exam: Normal sphincter tone without palpated masses or tenderness  Assessment/Plan:  51 y.o. MWF G3 P3  for annual exam with no complaints.  Plan: Menopause reviewed, encouraged lubricant with  intercourse for dryness. SBE's, continue annual screening mammogram is due instructed to schedule. Exercise, calcium rich diet, vitamin D 2000 daily encouraged. CBC, lipid panel, CMP, Pap normal with negative HR HPV 2017, new screening guidelines reviewed.    [place], 10:36 AM 09/10/2016

## 2016-09-10 NOTE — Patient Instructions (Signed)
Health Maintenance for Postmenopausal Women Menopause is a normal process in which your reproductive ability comes to an end. This process happens gradually over a span of months to years, usually between the ages of 33 and 38. Menopause is complete when you have missed 12 consecutive menstrual periods. It is important to talk with your health care provider about some of the most common conditions that affect postmenopausal women, such as heart disease, cancer, and bone loss (osteoporosis). Adopting a healthy lifestyle and getting preventive care can help to promote your health and wellness. Those actions can also lower your chances of developing some of these common conditions. What should I know about menopause? During menopause, you may experience a number of symptoms, such as:  Moderate-to-severe hot flashes.  Night sweats.  Decrease in sex drive.  Mood swings.  Headaches.  Tiredness.  Irritability.  Memory problems.  Insomnia. Choosing to treat or not to treat menopausal changes is an individual decision that you make with your health care provider. What should I know about hormone replacement therapy and supplements? Hormone therapy products are effective for treating symptoms that are associated with menopause, such as hot flashes and night sweats. Hormone replacement carries certain risks, especially as you become older. If you are thinking about using estrogen or estrogen with progestin treatments, discuss the benefits and risks with your health care provider. What should I know about heart disease and stroke? Heart disease, heart attack, and stroke become more likely as you age. This may be due, in part, to the hormonal changes that your body experiences during menopause. These can affect how your body processes dietary fats, triglycerides, and cholesterol. Heart attack and stroke are both medical emergencies. There are many things that you can do to help prevent heart disease  and stroke:  Have your blood pressure checked at least every 1-2 years. High blood pressure causes heart disease and increases the risk of stroke.  If you are 48-61 years old, ask your health care provider if you should take aspirin to prevent a heart attack or a stroke.  Do not use any tobacco products, including cigarettes, chewing tobacco, or electronic cigarettes. If you need help quitting, ask your health care provider.  It is important to eat a healthy diet and maintain a healthy weight.  Be sure to include plenty of vegetables, fruits, low-fat dairy products, and lean protein.  Avoid eating foods that are high in solid fats, added sugars, or salt (sodium).  Get regular exercise. This is one of the most important things that you can do for your health.  Try to exercise for at least 150 minutes each week. The type of exercise that you do should increase your heart rate and make you sweat. This is known as moderate-intensity exercise.  Try to do strengthening exercises at least twice each week. Do these in addition to the moderate-intensity exercise.  Know your numbers.Ask your health care provider to check your cholesterol and your blood glucose. Continue to have your blood tested as directed by your health care provider. What should I know about cancer screening? There are several types of cancer. Take the following steps to reduce your risk and to catch any cancer development as early as possible. Breast Cancer  Practice breast self-awareness.  This means understanding how your breasts normally appear and feel.  It also means doing regular breast self-exams. Let your health care provider know about any changes, no matter how small.  If you are 40 or older,  have a clinician do a breast exam (clinical breast exam or CBE) every year. Depending on your age, family history, and medical history, it may be recommended that you also have a yearly breast X-ray (mammogram).  If you  have a family history of breast cancer, talk with your health care provider about genetic screening.  If you are at high risk for breast cancer, talk with your health care provider about having an MRI and a mammogram every year.  Breast cancer (BRCA) gene test is recommended for women who have family members with BRCA-related cancers. Results of the assessment will determine the need for genetic counseling and BRCA1 and for BRCA2 testing. BRCA-related cancers include these types:  Breast. This occurs in males or females.  Ovarian.  Tubal. This may also be called fallopian tube cancer.  Cancer of the abdominal or pelvic lining (peritoneal cancer).  Prostate.  Pancreatic. Cervical, Uterine, and Ovarian Cancer  Your health care provider may recommend that you be screened regularly for cancer of the pelvic organs. These include your ovaries, uterus, and vagina. This screening involves a pelvic exam, which includes checking for microscopic changes to the surface of your cervix (Pap test).  For women ages 21-65, health care providers may recommend a pelvic exam and a Pap test every three years. For women ages 23-65, they may recommend the Pap test and pelvic exam, combined with testing for human papilloma virus (HPV), every five years. Some types of HPV increase your risk of cervical cancer. Testing for HPV may also be done on women of any age who have unclear Pap test results.  Other health care providers may not recommend any screening for nonpregnant women who are considered low risk for pelvic cancer and have no symptoms. Ask your health care provider if a screening pelvic exam is right for you.  If you have had past treatment for cervical cancer or a condition that could lead to cancer, you need Pap tests and screening for cancer for at least 20 years after your treatment. If Pap tests have been discontinued for you, your risk factors (such as having a new sexual partner) need to be reassessed  to determine if you should start having screenings again. Some women have medical problems that increase the chance of getting cervical cancer. In these cases, your health care provider may recommend that you have screening and Pap tests more often.  If you have a family history of uterine cancer or ovarian cancer, talk with your health care provider about genetic screening.  If you have vaginal bleeding after reaching menopause, tell your health care provider.  There are currently no reliable tests available to screen for ovarian cancer. Lung Cancer  Lung cancer screening is recommended for adults 99-83 years old who are at high risk for lung cancer because of a history of smoking. A yearly low-dose CT scan of the lungs is recommended if you:  Currently smoke.  Have a history of at least 30 pack-years of smoking and you currently smoke or have quit within the past 15 years. A pack-year is smoking an average of one pack of cigarettes per day for one year. Yearly screening should:  Continue until it has been 15 years since you quit.  Stop if you develop a health problem that would prevent you from having lung cancer treatment. Colorectal Cancer  This type of cancer can be detected and can often be prevented.  Routine colorectal cancer screening usually begins at age 72 and continues  through age 75.  If you have risk factors for colon cancer, your health care provider may recommend that you be screened at an earlier age.  If you have a family history of colorectal cancer, talk with your health care provider about genetic screening.  Your health care provider may also recommend using home test kits to check for hidden blood in your stool.  A small camera at the end of a tube can be used to examine your colon directly (sigmoidoscopy or colonoscopy). This is done to check for the earliest forms of colorectal cancer.  Direct examination of the colon should be repeated every 5-10 years until  age 75. However, if early forms of precancerous polyps or small growths are found or if you have a family history or genetic risk for colorectal cancer, you may need to be screened more often. Skin Cancer  Check your skin from head to toe regularly.  Monitor any moles. Be sure to tell your health care provider:  About any new moles or changes in moles, especially if there is a change in a mole's shape or color.  If you have a mole that is larger than the size of a pencil eraser.  If any of your family members has a history of skin cancer, especially at a Lonie Rummell age, talk with your health care provider about genetic screening.  Always use sunscreen. Apply sunscreen liberally and repeatedly throughout the day.  Whenever you are outside, protect yourself by wearing long sleeves, pants, a wide-brimmed hat, and sunglasses. What should I know about osteoporosis? Osteoporosis is a condition in which bone destruction happens more quickly than new bone creation. After menopause, you may be at an increased risk for osteoporosis. To help prevent osteoporosis or the bone fractures that can happen because of osteoporosis, the following is recommended:  If you are 19-50 years old, get at least 1,000 mg of calcium and at least 600 mg of vitamin D per day.  If you are older than age 50 but younger than age 70, get at least 1,200 mg of calcium and at least 600 mg of vitamin D per day.  If you are older than age 70, get at least 1,200 mg of calcium and at least 800 mg of vitamin D per day. Smoking and excessive alcohol intake increase the risk of osteoporosis. Eat foods that are rich in calcium and vitamin D, and do weight-bearing exercises several times each week as directed by your health care provider. What should I know about how menopause affects my mental health? Depression may occur at any age, but it is more common as you become older. Common symptoms of depression include:  Low or sad  mood.  Changes in sleep patterns.  Changes in appetite or eating patterns.  Feeling an overall lack of motivation or enjoyment of activities that you previously enjoyed.  Frequent crying spells. Talk with your health care provider if you think that you are experiencing depression. What should I know about immunizations? It is important that you get and maintain your immunizations. These include:  Tetanus, diphtheria, and pertussis (Tdap) booster vaccine.  Influenza every year before the flu season begins.  Pneumonia vaccine.  Shingles vaccine. Your health care provider may also recommend other immunizations. This information is not intended to replace advice given to you by your health care provider. Make sure you discuss any questions you have with your health care provider. Document Released: 06/15/2005 Document Revised: 11/11/2015 Document Reviewed: 01/25/2015 Elsevier Interactive Patient   Education  2017 [employer]  

## 2016-10-02 ENCOUNTER — Other Ambulatory Visit: Payer: Self-pay | Admitting: Women's Health

## 2016-10-02 DIAGNOSIS — Z1231 Encounter for screening mammogram for malignant neoplasm of breast: Secondary | ICD-10-CM

## 2016-10-08 ENCOUNTER — Ambulatory Visit
Admission: RE | Admit: 2016-10-08 | Discharge: 2016-10-08 | Disposition: A | Discharge status: Home / Self Care | Payer: PRIVATE HEALTH INSURANCE | Source: Ambulatory Visit | Attending: Women's Health | Admitting: Women's Health

## 2016-10-08 DIAGNOSIS — Z1231 Encounter for screening mammogram for malignant neoplasm of breast: Secondary | ICD-10-CM

## 2016-10-09 ENCOUNTER — Encounter: Payer: Self-pay | Admitting: Women's Health

## 2017-12-03 ENCOUNTER — Encounter (INDEPENDENT_AMBULATORY_CARE_PROVIDER_SITE_OTHER): Payer: Self-pay

## 2017-12-03 ENCOUNTER — Encounter: Payer: Self-pay | Admitting: Women's Health

## 2017-12-03 ENCOUNTER — Ambulatory Visit (INDEPENDENT_AMBULATORY_CARE_PROVIDER_SITE_OTHER): Payer: 59 | Admitting: Women's Health

## 2017-12-03 VITALS — BP 114/78 | Ht 63.5 in | Wt 137.6 lb

## 2017-12-03 DIAGNOSIS — Z01419 Encounter for gynecological examination (general) (routine) without abnormal findings: Secondary | ICD-10-CM | POA: Diagnosis not present

## 2017-12-03 DIAGNOSIS — Z13220 Encounter for screening for lipoid disorders: Secondary | ICD-10-CM | POA: Diagnosis not present

## 2017-12-03 LAB — LIPID PANEL
CHOL/HDL RATIO: 2.9 (calc) (ref <5.0)
CHOLESTEROL: 224 mg/dL — AB (ref <200)
HDL: 76 mg/dL (ref >50)
LDL CHOLESTEROL (CALC): 129 mg/dL — AB
Non-HDL Cholesterol (Calc): 148 mg/dL (calc) — ABNORMAL HIGH (ref <130)
Triglycerides: 89 mg/dL (ref <150)

## 2017-12-03 LAB — CBC WITH DIFFERENTIAL/PLATELET
Basophils Absolute: 22 cells/uL (ref 0–200)
Basophils Relative: 0.4 %
EOS ABS: 83 {cells}/uL (ref 15–500)
Eosinophils Relative: 1.5 %
HEMATOCRIT: 41.1 % (ref 35.0–45.0)
Hemoglobin: 13.8 g/dL (ref 11.7–15.5)
LYMPHS ABS: 1958 {cells}/uL (ref 850–3900)
MCH: 30.9 pg (ref 27.0–33.0)
MCHC: 33.6 g/dL (ref 32.0–36.0)
MCV: 91.9 fL (ref 80.0–100.0)
MPV: 9.9 fL (ref 7.5–12.5)
Monocytes Relative: 7.8 %
Neutro Abs: 3009 cells/uL (ref 1500–7800)
Neutrophils Relative %: 54.7 %
PLATELETS: 294 10*3/uL (ref 140–400)
RBC: 4.47 10*6/uL (ref 3.80–5.10)
RDW: 12.1 % (ref 11.0–15.0)
TOTAL LYMPHOCYTE: 35.6 %
WBC: 5.5 10*3/uL (ref 3.8–10.8)
WBCMIX: 429 {cells}/uL (ref 200–950)

## 2017-12-03 LAB — COMPREHENSIVE METABOLIC PANEL
AG Ratio: 1.9 (calc) (ref 1.0–2.5)
ALKALINE PHOSPHATASE (APISO): 66 U/L (ref 33–130)
ALT: 15 U/L (ref 6–29)
AST: 22 U/L (ref 10–35)
Albumin: 4.8 g/dL (ref 3.6–5.1)
BILIRUBIN TOTAL: 0.7 mg/dL (ref 0.2–1.2)
BUN: 17 mg/dL (ref 7–25)
CALCIUM: 10.3 mg/dL (ref 8.6–10.4)
CO2: 28 mmol/L (ref 20–32)
CREATININE: 0.73 mg/dL (ref 0.50–1.05)
Chloride: 103 mmol/L (ref 98–110)
Globulin: 2.5 g/dL (calc) (ref 1.9–3.7)
Glucose, Bld: 103 mg/dL — ABNORMAL HIGH (ref 65–99)
POTASSIUM: 4.4 mmol/L (ref 3.5–5.3)
Sodium: 139 mmol/L (ref 135–146)
Total Protein: 7.3 g/dL (ref 6.1–8.1)

## 2017-12-03 NOTE — Patient Instructions (Addendum)
Colonoscopy  [facility]  [place] i[place] daily   Health Maintenance for Postmenopausal Women Menopause is a normal process in which your reproductive ability comes to an end. This process happens gradually over a span of months to years, usually between the ages of 66 and 55. Menopause is complete when you have missed 12 consecutive menstrual periods. It is important to talk with your health care provider about some of the most common conditions that affect postmenopausal women, such as heart disease, cancer, and bone loss (osteoporosis). Adopting a healthy lifestyle and getting preventive care can help to promote your health and wellness. Those actions can also lower your chances of developing some of these common conditions. What should I know about menopause? During menopause, you may experience a number of symptoms, such as:  Moderate-to-severe hot flashes.  Night sweats.  Decrease in sex drive.  Mood swings.  Headaches.  Tiredness.  Irritability.  Memory problems.  Insomnia.  Choosing to treat or not to treat menopausal changes is an individual decision that you make with your health care provider. What should I know about hormone replacement therapy and supplements? Hormone therapy products are effective for treating symptoms that are associated with menopause, such as hot flashes and night sweats. Hormone replacement carries certain risks, especially as you become older. If you are thinking about using estrogen or estrogen with progestin treatments, discuss the benefits and risks with your health care provider. What should I know about heart disease and stroke? Heart disease, heart attack, and stroke become more likely as you age. This may be due, in part, to the hormonal changes that your body experiences during menopause. These can affect how your body processes dietary fats, triglycerides, and cholesterol. Heart attack and stroke are both medical  emergencies. There are many things that you can do to help prevent heart disease and stroke:  Have your blood pressure checked at least every 1-2 years. High blood pressure causes heart disease and increases the risk of stroke.  If you are 50-57 years old, ask your health care provider if you should take aspirin to prevent a heart attack or a stroke.  Do not use any tobacco products, including cigarettes, chewing tobacco, or electronic cigarettes. If you need help quitting, ask your health care provider.  It is important to eat a healthy diet and maintain a healthy weight. ? Be sure to include plenty of vegetables, fruits, low-fat dairy products, and lean protein. ? Avoid eating foods that are high in solid fats, added sugars, or salt (sodium).  Get regular exercise. This is one of the most important things that you can do for your health. ? Try to exercise for at least 150 minutes each week. The type of exercise that you do should increase your heart rate and make you sweat. This is known as moderate-intensity exercise. ? Try to do strengthening exercises at least twice each week. Do these in addition to the moderate-intensity exercise.  Know your numbers.Ask your health care provider to check your cholesterol and your blood glucose. Continue to have your blood tested as directed by your health care provider.  What should I know about cancer screening? There are several types of cancer. Take the following steps to reduce your risk and to catch any cancer development as early as possible. Breast Cancer  Practice breast self-awareness. ? This means understanding how your breasts normally appear and feel. ? It also means doing regular breast self-exams.  Let your health care provider know about any changes, no matter how small.  If you are 50 or older, have a clinician do a breast exam (clinical breast exam or CBE) every year. Depending on your age, family history, and medical history, it  may be recommended that you also have a yearly breast X-ray (mammogram).  If you have a family history of breast cancer, talk with your health care provider about genetic screening.  If you are at high risk for breast cancer, talk with your health care provider about having an MRI and a mammogram every year.  Breast cancer (BRCA) gene test is recommended for women who have family members with BRCA-related cancers. Results of the assessment will determine the need for genetic counseling and BRCA1 and for BRCA2 testing. BRCA-related cancers include these types: ? Breast. This occurs in males or females. ? Ovarian. ? Tubal. This may also be called fallopian tube cancer. ? Cancer of the abdominal or pelvic lining (peritoneal cancer). ? Prostate. ? Pancreatic.  Cervical, Uterine, and Ovarian Cancer Your health care provider may recommend that you be screened regularly for cancer of the pelvic organs. These include your ovaries, uterus, and vagina. This screening involves a pelvic exam, which includes checking for microscopic changes to the surface of your cervix (Pap test).  For women ages 21-65, health care providers may recommend a pelvic exam and a Pap test every three years. For women ages 50-65, they may recommend the Pap test and pelvic exam, combined with testing for human papilloma virus (HPV), every five years. Some types of HPV increase your risk of cervical cancer. Testing for HPV may also be done on women of any age who have unclear Pap test results.  Other health care providers may not recommend any screening for nonpregnant women who are considered low risk for pelvic cancer and have no symptoms. Ask your health care provider if a screening pelvic exam is right for you.  If you have had past treatment for cervical cancer or a condition that could lead to cancer, you need Pap tests and screening for cancer for at least 20 years after your treatment. If Pap tests have been discontinued  for you, your risk factors (such as having a new sexual partner) need to be reassessed to determine if you should start having screenings again. Some women have medical problems that increase the chance of getting cervical cancer. In these cases, your health care provider may recommend that you have screening and Pap tests more often.  If you have a family history of uterine cancer or ovarian cancer, talk with your health care provider about genetic screening.  If you have vaginal bleeding after reaching menopause, tell your health care provider.  There are currently no reliable tests available to screen for ovarian cancer.  Lung Cancer Lung cancer screening is recommended for adults 49-20 years old who are at high risk for lung cancer because of a history of smoking. A yearly low-dose CT scan of the lungs is recommended if you:  Currently smoke.  Have a history of at least 30 pack-years of smoking and you currently smoke or have quit within the past 15 years. A pack-year is smoking an average of one pack of cigarettes per day for one year.  Yearly screening should:  Continue until it has been 15 years since you quit.  Stop if you develop a health problem that would prevent you from having lung cancer treatment.  Colorectal Cancer  This type  of cancer can be detected and can often be prevented.  Routine colorectal cancer screening usually begins at age 55 and continues through age 47.  If you have risk factors for colon cancer, your health care provider may recommend that you be screened at an earlier age.  If you have a family history of colorectal cancer, talk with your health care provider about genetic screening.  Your health care provider may also recommend using home test kits to check for hidden blood in your stool.  A small camera at the end of a tube can be used to examine your colon directly (sigmoidoscopy or colonoscopy). This is done to check for the earliest forms of  colorectal cancer.  Direct examination of the colon should be repeated every 5-10 years until age 37. However, if early forms of precancerous polyps or small growths are found or if you have a family history or genetic risk for colorectal cancer, you may need to be screened more often.  Skin Cancer  Check your skin from head to toe regularly.  Monitor any moles. Be sure to tell your health care provider: ? About any new moles or changes in moles, especially if there is a change in a mole's shape or color. ? If you have a mole that is larger than the size of a pencil eraser.  If any of your family members has a history of skin cancer, especially at a Oren Barella age, talk with your health care provider about genetic screening.  Always use sunscreen. Apply sunscreen liberally and repeatedly throughout the day.  Whenever you are outside, protect yourself by wearing long sleeves, pants, a wide-brimmed hat, and sunglasses.  What should I know about osteoporosis? Osteoporosis is a condition in which bone destruction happens more quickly than new bone creation. After menopause, you may be at an increased risk for osteoporosis. To help prevent osteoporosis or the bone fractures that can happen because of osteoporosis, the following is recommended:  If you are 72-29 years old, get at least 1,000 mg of calcium and at least 600 mg of vitamin D per day.  If you are older than age 80 but younger than age 43, get at least 1,200 mg of calcium and at least 600 mg of vitamin D per day.  If you are older than age 60, get at least 1,200 mg of calcium and at least 800 mg of vitamin D per day.  Smoking and excessive alcohol intake increase the risk of osteoporosis. Eat foods that are rich in calcium and vitamin D, and do weight-bearing exercises several times each week as directed by your health care provider. What should I know about how menopause affects my mental health? Depression may occur at any age, but it  is more common as you become older. Common symptoms of depression include:  Low or sad mood.  Changes in sleep patterns.  Changes in appetite or eating patterns.  Feeling an overall lack of motivation or enjoyment of activities that you previously enjoyed.  Frequent crying spells.  Talk with your health care provider if you think that you are experiencing depression. What should I know about immunizations? It is important that you get and maintain your immunizations. These include:  Tetanus, diphtheria, and pertussis (Tdap) booster vaccine.  Influenza every year before the flu season begins.  Pneumonia vaccine.  Shingles vaccine.  Your health care provider may also recommend other immunizations. This information is not intended to replace advice given to you by your health  care provider. Make sure you discuss any questions you have with your health care provider. Document Released: 06/15/2005 Document Revised: 11/11/2015 Document Reviewed: 01/25/2015 Elsevier Interactive Patient Education  2018 [employer].

## 2017-12-03 NOTE — Progress Notes (Signed)
[name] 50-Mar-1967 [id number]    History:    Presents for annual exam.  Last menstrual cycle September 2018, occasional hot flashes but tolerable.  BTL.  Normal Pap and mammogram history.  Has not had a screening colonoscopy.  Past medical history, past surgical history, family history and social history were all reviewed and documented in the EPIC chart.  Hospice nurse.  3 teenagers ages 11, 50 and 96 all doing well and have received Gardasil.  mother has Alzheimer's 50-hour care at home.  ROS:  A ROS was performed and pertinent positives and negatives are included.  Exam:  Vitals:   12/03/17 0832  BP: 114/78  Weight: 137 lb 9.6 oz (62.4 kg)  Height: 5' 3.5" (1.613 m)   Body mass index is 23.99 kg/m.   General appearance:  Normal Thyroid:  Symmetrical, normal in size, without palpable masses or nodularity. Respiratory  Auscultation:  Clear without wheezing or rhonchi Cardiovascular  Auscultation:  Regular rate, without rubs, murmurs or gallops  Edema/varicosities:  Not grossly evident Abdominal  Soft,nontender, without masses, guarding or rebound.  Liver/spleen:  No organomegaly noted  Hernia:  None appreciated  Skin  Inspection:  Grossly normal   Breasts: Examined lying and sitting.     Right: Without masses, retractions, discharge or axillary adenopathy.     Left: Without masses, retractions, discharge or axillary adenopathy. Gentitourinary   Inguinal/mons:  Normal without inguinal adenopathy  External genitalia:  Normal  BUS/Urethra/Skene's glands:  Normal  Vagina:  Normal  Cervix:  Normal  Uterus: normal in size, shape and contour.  Midline and mobile  Adnexa/parametria:     Rt: Without masses or tenderness.   Lt: Without masses or tenderness.  Anus and perineum: Normal  Digital rectal exam: Normal sphincter tone without palpated masses or tenderness  Assessment/Plan:  52 y.o. MWF G3, P3 for annual exam with no complaints.  Postmenopausal/minimal  symptoms/BTL  Plan: Instructed to call if any further bleeding.  Screening colonoscopy reviewed [facility] information given instructed to schedule.  SBE's, continue annual screening mammogram, calcium rich foods, vitamin D 2000 daily encouraged.  Reviewed importance of weightbearing, balance type exercise.  CBC, lipid panel, CMP, Pap normal 2017, new screening guidelines reviewed.    [name] [facility], 9:07 AM 12/03/2017

## 2018-01-23 DIAGNOSIS — Z23 Encounter for immunization: Secondary | ICD-10-CM | POA: Diagnosis not present

## 2018-09-22 ENCOUNTER — Encounter: Payer: Self-pay | Admitting: Women's Health

## 2018-11-06 ENCOUNTER — Telehealth: Payer: PRIVATE HEALTH INSURANCE | Admitting: Family

## 2018-11-06 DIAGNOSIS — Z1159 Encounter for screening for other viral diseases: Secondary | ICD-10-CM | POA: Diagnosis not present

## 2018-11-06 DIAGNOSIS — Z20822 Contact with and (suspected) exposure to covid-19: Secondary | ICD-10-CM

## 2018-11-06 NOTE — Progress Notes (Signed)
E-Visit for Corona Virus Screening   Your current symptoms could be consistent with the coronavirus.  Call your health care provider or local health department to request and arrange formal testing. Many health care providers can now test patients at their office but not all are.  Please quarantine yourself while awaiting your test results.  [place] [phone], [place], [facility] Department [phone] or visit [email or web address]   THIS IS LIKELY ALLERGIES BUT IF YOU ARE CONCERNED, SCHEDULE TESTING WITH ONE OF THE SITES ABOVE.  COVID-19 is a respiratory illness with symptoms that are similar to the flu. Symptoms are typically mild to moderate, but there have been cases of severe illness and death due to the virus. The following symptoms may appear 2-14 days after exposure: . Fever . Cough . Shortness of breath or difficulty breathing . Chills . Repeated shaking with chills . Muscle pain . Headache . Sore throat . New loss of taste or smell . Fatigue . Congestion or runny nose . Nausea or vomiting . Diarrhea  It is vitally important that if you feel that you have an infection such as this virus or any other virus that you stay home and away from places where you may spread it to others.  You should self-quarantine for 14 days if you have symptoms that could potentially be coronavirus or have been in close contact a with a person diagnosed with COVID-19 within the last 2 weeks. You should avoid contact with people age 7 and older.   You should wear a mask or cloth face covering over your nose and mouth if you must be around other people or animals, including pets (even at home). Try to stay at least 6 feet away from other people. This will protect the people around you.   You may also take acetaminophen (Tylenol) as needed for fever.   Reduce your risk  of any infection by using the same precautions used for avoiding the common cold or flu:  Marland Kitchen Wash your hands often with soap and warm water for at least 20 seconds.  If soap and water are not readily available, use an alcohol-based hand sanitizer with at least 60% alcohol.  . If coughing or sneezing, cover your mouth and nose by coughing or sneezing into the elbow areas of your shirt or coat, into a tissue or into your sleeve (not your hands). . Avoid shaking hands with others and consider head nods or verbal greetings only. . Avoid touching your eyes, nose, or mouth with unwashed hands.  . Avoid close contact with people who are sick. . Avoid places or events with large numbers of people in one location, like concerts or sporting events. . Carefully consider travel plans you have or are making. . If you are planning any travel outside or inside the [place], visit the CDC's Travelers' Health webpage for the latest health notices. . If you have some symptoms but not all symptoms, continue to monitor at home and seek medical attention if your symptoms worsen. . If you are having a medical emergency, call 911.  HOME CARE . Only take medications as instructed by your medical team. . Drink plenty of fluids and get plenty of rest. . A steam or ultrasonic humidifier can help if you have congestion.   GET HELP RIGHT AWAY IF YOU HAVE EMERGENCY WARNING SIGNS** FOR COVID-19. If you or someone is showing any of these signs seek emergency medical care immediately. Call 911  or proceed to your closest emergency facility if: . You develop worsening high fever. . Trouble breathing . Bluish lips or face . Persistent pain or pressure in the chest . New confusion . Inability to wake or stay awake . You cough up blood. . Your symptoms become more severe  **This list is not all possible symptoms. Contact your medical provider for any symptoms that are sever or concerning to you.   MAKE SURE YOU   Understand  these instructions.  Will watch your condition.  Will get help right away if you are not doing well or get worse.  Your e-visit answers were reviewed by a board certified advanced clinical practitioner to complete your personal care plan.  Depending on the condition, your plan could have included both over the counter or prescription medications.  If there is a problem please reply once you have received a response from your provider.  Your safety is important to [place].  If you have drug allergies check your prescription carefully.    You can use MyChart to ask questions about today's visit, request a non-urgent call back, or ask for a work or school excuse for 24 hours related to this e-Visit. If it has been greater than 24 hours you will need to follow up with your provider, or enter a new e-Visit to address those concerns. You will get an e-mail in the next two days asking about your experience.  I hope that your e-visit has been valuable and will speed your recovery. Thank you for using e-visits.   Greater than 5 minutes, yet less than 10 minutes of time have been spent researching, coordinating, and implementing care for this patient today.  Thank you for the details you included in the comment boxes. Those details are very helpful in determining the best course of treatment for you and help [place] to provide the best care.

## 2018-11-26 DIAGNOSIS — D485 Neoplasm of uncertain behavior of skin: Secondary | ICD-10-CM | POA: Diagnosis not present

## 2018-11-26 DIAGNOSIS — L570 Actinic keratosis: Secondary | ICD-10-CM | POA: Diagnosis not present

## 2018-11-26 DIAGNOSIS — L814 Other melanin hyperpigmentation: Secondary | ICD-10-CM | POA: Diagnosis not present

## 2018-11-26 DIAGNOSIS — C44712 Basal cell carcinoma of skin of right lower limb, including hip: Secondary | ICD-10-CM | POA: Diagnosis not present

## 2018-12-08 ENCOUNTER — Other Ambulatory Visit: Payer: Self-pay

## 2018-12-09 ENCOUNTER — Ambulatory Visit (INDEPENDENT_AMBULATORY_CARE_PROVIDER_SITE_OTHER): Payer: 59 | Admitting: Women's Health

## 2018-12-09 ENCOUNTER — Encounter: Payer: Self-pay | Admitting: Women's Health

## 2018-12-09 VITALS — BP 122/82 | Ht 63.0 in | Wt 124.0 lb

## 2018-12-09 DIAGNOSIS — E559 Vitamin D deficiency, unspecified: Secondary | ICD-10-CM

## 2018-12-09 DIAGNOSIS — Z13220 Encounter for screening for lipoid disorders: Secondary | ICD-10-CM | POA: Diagnosis not present

## 2018-12-09 DIAGNOSIS — Z01419 Encounter for gynecological examination (general) (routine) without abnormal findings: Secondary | ICD-10-CM | POA: Diagnosis not present

## 2018-12-09 NOTE — Progress Notes (Signed)
la 

## 2018-12-09 NOTE — Patient Instructions (Signed)
[facility]  Dr Carlean Purl  [phone]  Breast center [phone]    Health Maintenance After Age 51 After age 87, you are at a higher risk for certain long-term diseases and infections as well as injuries from falls. Falls are a major cause of broken bones and head injuries in people who are older than age 9. Getting regular preventive care can help to keep you healthy and well. Preventive care includes getting regular testing and making lifestyle changes as recommended by your health care provider. Talk with your health care provider about:  Which screenings and tests you should have. A screening is a test that checks for a disease when you have no symptoms.  A diet and exercise plan that is right for you. What should I know about screenings and tests to prevent falls? Screening and testing are the best ways to find a health problem early. Early diagnosis and treatment give you the best chance of managing medical conditions that are common after age 59. Certain conditions and lifestyle choices may make you more likely to have a fall. Your health care provider may recommend:  Regular vision checks. Poor vision and conditions such as cataracts can make you more likely to have a fall. If you wear glasses, make sure to get your prescription updated if your vision changes.  Medicine review. Work with your health care provider to regularly review all of the medicines you are taking, including over-the-counter medicines. Ask your health care provider about any side effects that may make you more likely to have a fall. Tell your health care provider if any medicines that you take make you feel dizzy or sleepy.  Osteoporosis screening. Osteoporosis is a condition that causes the bones to get weaker. This can make the bones weak and cause them to break more easily.  Blood pressure screening. Blood pressure changes and medicines to control blood pressure can make you feel dizzy.  Strength and balance checks.  Your health care provider may recommend certain tests to check your strength and balance while standing, walking, or changing positions.  Foot health exam. Foot pain and numbness, as well as not wearing proper footwear, can make you more likely to have a fall.  Depression screening. You may be more likely to have a fall if you have a fear of falling, feel emotionally low, or feel unable to do activities that you used to do.  Alcohol use screening. Using too much alcohol can affect your balance and may make you more likely to have a fall. What actions can I take to lower my risk of falls? General instructions  Talk with your health care provider about your risks for falling. Tell your health care provider if: ? You fall. Be sure to tell your health care provider about all falls, even ones that seem minor. ? You feel dizzy, sleepy, or off-balance.  Take over-the-counter and prescription medicines only as told by your health care provider. These include any supplements.  Eat a healthy diet and maintain a healthy weight. A healthy diet includes low-fat dairy products, low-fat (lean) meats, and fiber from whole grains, beans, and lots of fruits and vegetables. Home safety  Remove any tripping hazards, such as rugs, cords, and clutter.  Install safety equipment such as grab bars in bathrooms and safety rails on stairs.  Keep rooms and walkways well-lit. Activity   Follow a regular exercise program to stay fit. This will help you maintain your balance. Ask your health care provider what  types of exercise are appropriate for you.  If you need a cane or walker, use it as recommended by your health care provider.  Wear supportive shoes that have nonskid soles. Lifestyle  Do not drink alcohol if your health care provider tells you not to drink.  If you drink alcohol, limit how much you have: ? 0-1 drink a day for women. ? 0-2 drinks a day for men.  Be aware of how much alcohol is in your  drink. In the [place] one drink equals one typical bottle of beer (12 oz), one-half glass of wine (5 oz), or one shot of hard liquor (1 oz).  Do not use any products that contain nicotine or tobacco, such as cigarettes and e-cigarettes. If you need help quitting, ask your health care provider. Summary  Having a healthy lifestyle and getting preventive care can help to protect your health and wellness after age 57.  Screening and testing are the best way to find a health problem early and help you avoid having a fall. Early diagnosis and treatment give you the best chance for managing medical conditions that are more common for people who are older than age 74.  Falls are a major cause of broken bones and head injuries in people who are older than age 75. Take precautions to prevent a fall at home.  Work with your health care provider to learn what changes you can make to improve your health and wellness and to prevent falls. This information is not intended to replace advice given to you by your health care provider. Make sure you discuss any questions you have with your health care provider. Document Released: 03/06/2017 Document Revised: 08/14/2018 Document Reviewed: 03/06/2017 Elsevier Patient Education  2020 [employer].

## 2018-12-09 NOTE — Progress Notes (Signed)
[name] 1965/08/23 [id number]    History:    Presents for annual exam.  Postmenopausal x2 years with no bleeding no HRT.  Normal Pap and mammogram history.  Overdue for mammogram.  Has not had a screening colonoscopy.  Has lost 15 pounds in the past year with diet and exercise.  Past medical history, past surgical history, family history and social history were all reviewed and documented in the EPIC chart.  Hospice nurse.  Has 3 children ages 32, 3 and 28 all doing well Gardasil series completed.  Father deceased from pancreatic cancer.  Mother has Alzheimer's living in assisted care.  ROS:  A ROS was performed and pertinent positives and negatives are included.  Exam:  Vitals:   12/09/18 1029  BP: 122/82  Weight: 124 lb (56.2 kg)  Height: 5\' 3"  (1.6 m)   Body mass index is 21.97 kg/m.   General appearance:  Normal Thyroid:  Symmetrical, normal in size, without palpable masses or nodularity. Respiratory  Auscultation:  Clear without wheezing or rhonchi Cardiovascular  Auscultation:  Regular rate, without rubs, murmurs or gallops  Edema/varicosities:  Not grossly evident Abdominal  Soft,nontender, without masses, guarding or rebound.  Liver/spleen:  No organomegaly noted  Hernia:  None appreciated  Skin  Inspection:  Grossly normal   Breasts: Examined lying and sitting.     Right: Without masses, retractions, discharge or axillary adenopathy.     Left: Without masses, retractions, discharge or axillary adenopathy. Gentitourinary   Inguinal/mons:  Normal without inguinal adenopathy  External genitalia:  Normal  BUS/Urethra/Skene's glands:  Normal  Vagina:  Normal  Cervix:  Normal  Uterus: normal in size, shape and contour.  Midline and mobile  Adnexa/parametria:     Rt: Without masses or tenderness.   Lt: Without masses or tenderness.  Anus and perineum: Normal  Digital rectal exam: Normal sphincter tone without palpated masses or  tenderness  Assessment/Plan:  53 y.o. MWF G3, P3 for annual exam with no complaints.  Postmenopausal/no HRT/no bleeding  Plan: SBEs, reviewed importance of annual screening mammogram breast center information given instructed to schedule.  Screening colonoscopy reviewed, [facility] information given instructed to schedule.  Continue healthy lifestyle with healthy diet and regular exercise, vitamin D 2000 daily encouraged.  CBC, CMP, lipid panel, vitamin D, Pap with HR HPV typing.  New screening guidelines reviewed.    [name] [facility], 11:04 AM 12/09/2018

## 2018-12-10 ENCOUNTER — Encounter: Payer: Self-pay | Admitting: Women's Health

## 2018-12-10 LAB — COMPREHENSIVE METABOLIC PANEL
AG Ratio: 1.9 (calc) (ref 1.0–2.5)
ALT: 15 U/L (ref 6–29)
AST: 23 U/L (ref 10–35)
Albumin: 5 g/dL (ref 3.6–5.1)
Alkaline phosphatase (APISO): 57 U/L (ref 37–153)
BUN: 20 mg/dL (ref 7–25)
CO2: 28 mmol/L (ref 20–32)
Calcium: 11 mg/dL — ABNORMAL HIGH (ref 8.6–10.4)
Chloride: 102 mmol/L (ref 98–110)
Creat: 0.8 mg/dL (ref 0.50–1.05)
Globulin: 2.6 g/dL (calc) (ref 1.9–3.7)
Glucose, Bld: 88 mg/dL (ref 65–99)
Potassium: 4.9 mmol/L (ref 3.5–5.3)
Sodium: 139 mmol/L (ref 135–146)
Total Bilirubin: 0.8 mg/dL (ref 0.2–1.2)
Total Protein: 7.6 g/dL (ref 6.1–8.1)

## 2018-12-10 LAB — PAP, TP IMAGING W/ HPV RNA, RFLX HPV TYPE 16,18/45: HPV DNA High Risk: NOT DETECTED

## 2018-12-10 LAB — CBC WITH DIFFERENTIAL/PLATELET
Absolute Monocytes: 566 cells/uL (ref 200–950)
Basophils Absolute: 20 cells/uL (ref 0–200)
Basophils Relative: 0.3 %
Eosinophils Absolute: 111 cells/uL (ref 15–500)
Eosinophils Relative: 1.7 %
HCT: 42.8 % (ref 35.0–45.0)
Hemoglobin: 14.3 g/dL (ref 11.7–15.5)
Lymphs Abs: 2249 cells/uL (ref 850–3900)
MCH: 31.4 pg (ref 27.0–33.0)
MCHC: 33.4 g/dL (ref 32.0–36.0)
MCV: 93.9 fL (ref 80.0–100.0)
MPV: 10 fL (ref 7.5–12.5)
Monocytes Relative: 8.7 %
Neutro Abs: 3556 cells/uL (ref 1500–7800)
Neutrophils Relative %: 54.7 %
Platelets: 302 10*3/uL (ref 140–400)
RBC: 4.56 10*6/uL (ref 3.80–5.10)
RDW: 12 % (ref 11.0–15.0)
Total Lymphocyte: 34.6 %
WBC: 6.5 10*3/uL (ref 3.8–10.8)

## 2018-12-10 LAB — VITAMIN D 25 HYDROXY (VIT D DEFICIENCY, FRACTURES): Vit D, 25-Hydroxy: 38 ng/mL (ref 30–100)

## 2018-12-10 LAB — LIPID PANEL
Cholesterol: 227 mg/dL — ABNORMAL HIGH (ref <200)
HDL: 79 mg/dL (ref >=50)
LDL Cholesterol (Calc): 127 mg/dL (calc) — ABNORMAL HIGH
Non-HDL Cholesterol (Calc): 148 mg/dL (calc) — ABNORMAL HIGH (ref <130)
Total CHOL/HDL Ratio: 2.9 (calc) (ref <5.0)
Triglycerides: 106 mg/dL (ref <150)

## 2018-12-30 ENCOUNTER — Encounter: Payer: Self-pay | Admitting: Gastroenterology

## 2019-01-08 DIAGNOSIS — C44712 Basal cell carcinoma of skin of right lower limb, including hip: Secondary | ICD-10-CM | POA: Diagnosis not present

## 2019-01-13 ENCOUNTER — Ambulatory Visit (AMBULATORY_SURGERY_CENTER): Payer: Self-pay | Admitting: *Deleted

## 2019-01-13 ENCOUNTER — Other Ambulatory Visit: Payer: Self-pay

## 2019-01-13 VITALS — Temp 97.7°F | Ht 63.5 in | Wt 127.4 lb

## 2019-01-13 DIAGNOSIS — Z1211 Encounter for screening for malignant neoplasm of colon: Secondary | ICD-10-CM

## 2019-01-13 MED ORDER — NA SULFATE-K SULFATE-MG SULF 17.5-3.13-1.6 GM/177ML PO SOLN: 1.0000 | Freq: Once | ORAL | 0 refills | Status: AC

## 2019-01-13 NOTE — Progress Notes (Signed)
No egg or soy allergy known to patient  No issues with past sedation with any surgeries  or procedures, no intubation problems  No diet pills per patient No home 02 use per patient  No blood thinners per patient  Pt denies issues with constipation  No A fib or A flutter  EMMI video sent to pt's e mail   suprep coupon provided

## 2019-01-14 ENCOUNTER — Encounter: Payer: Self-pay | Admitting: Gastroenterology

## 2019-01-26 ENCOUNTER — Telehealth: Payer: Self-pay

## 2019-01-26 NOTE — Telephone Encounter (Signed)
Covid-19 screening questions   Do you now or have you had a fever in the last 14 days? NO   Do you have any respiratory symptoms of shortness of breath or cough now or in the last 14 days? NO  Do you have any family members or close contacts with diagnosed or suspected Covid-19 in the past 14 days? NO  Have you been tested for Covid-19 and found to be positive? NO        

## 2019-01-27 ENCOUNTER — Ambulatory Visit (AMBULATORY_SURGERY_CENTER): Payer: 59 | Admitting: Gastroenterology

## 2019-01-27 ENCOUNTER — Other Ambulatory Visit: Payer: Self-pay

## 2019-01-27 ENCOUNTER — Encounter: Payer: Self-pay | Admitting: Gastroenterology

## 2019-01-27 ENCOUNTER — Other Ambulatory Visit: Payer: Self-pay | Admitting: Gastroenterology

## 2019-01-27 VITALS — BP 104/63 | HR 55 | Temp 97.4°F | Resp 13 | Ht 63.0 in | Wt 127.0 lb

## 2019-01-27 DIAGNOSIS — D125 Benign neoplasm of sigmoid colon: Secondary | ICD-10-CM | POA: Diagnosis not present

## 2019-01-27 DIAGNOSIS — D123 Benign neoplasm of transverse colon: Secondary | ICD-10-CM | POA: Diagnosis not present

## 2019-01-27 DIAGNOSIS — Z1211 Encounter for screening for malignant neoplasm of colon: Secondary | ICD-10-CM

## 2019-01-27 DIAGNOSIS — E7800 Pure hypercholesterolemia, unspecified: Secondary | ICD-10-CM | POA: Diagnosis not present

## 2019-01-27 MED ORDER — SODIUM CHLORIDE 0.9 % IV SOLN: 500.0000 mL | Freq: Once | INTRAVENOUS | Status: AC

## 2019-01-27 NOTE — Progress Notes (Signed)
Called to room to assist during endoscopic procedure.  Patient ID and intended procedure confirmed with present staff. Received instructions for my participation in the procedure from the performing physician.  

## 2019-01-27 NOTE — Progress Notes (Signed)
Report given to PACU, vss 

## 2019-01-27 NOTE — Op Note (Signed)
[facility] Patient Name: [name] Procedure Date: 01/27/2019 1:42 PM MRN: [id number] Endoscopist: Mauri Pole , MD Age: 53 Referring MD:  Date of Birth: 21-Jun-1965 Gender: Female Account #: [id number] Procedure:                Colonoscopy Indications:              Screening for colorectal malignant neoplasm Medicines:                Monitored Anesthesia Care Procedure:                Pre-Anesthesia Assessment:                           - Prior to the procedure, a History and Physical                            was performed, and patient medications and                            allergies were reviewed. The patient's tolerance of                            previous anesthesia was also reviewed. The risks                            and benefits of the procedure and the sedation                            options and risks were discussed with the patient.                            All questions were answered, and informed consent                            was obtained. Prior Anticoagulants: The patient has                            taken no previous anticoagulant or antiplatelet                            agents. ASA Grade Assessment: I - A normal, healthy                            patient. After reviewing the risks and benefits,                            the patient was deemed in satisfactory condition to                            undergo the procedure.                           After obtaining informed consent, the colonoscope  was passed under direct vision. Throughout the                            procedure, the patient's blood pressure, pulse, and                            oxygen saturations were monitored continuously. The                            Colonoscope was introduced through the anus and                            advanced to the the terminal ileum, with                            identification of the  appendiceal orifice and IC                            valve. The colonoscopy was performed without                            difficulty. The patient tolerated the procedure                            well. The quality of the bowel preparation was                            excellent. The terminal ileum, ileocecal valve,                            appendiceal orifice, and rectum were photographed. Scope In: 1:47:55 PM Scope Out: 2:05:57 PM Scope Withdrawal Time: 0 hours 13 minutes 4 seconds  Total Procedure Duration: 0 hours 18 minutes 2 seconds  Findings:                 The perianal and digital rectal examinations were                            normal.                           Two sessile polyps were found in the sigmoid colon                            and transverse colon. The polyps were 3 to 6 mm in                            size. These polyps were removed with a cold snare.                            Resection and retrieval were complete.                           Scattered small-mouthed diverticula were found in  the sigmoid colon, descending colon and transverse                            colon.                           Non-bleeding internal hemorrhoids were found during                            retroflexion. The hemorrhoids were small. Complications:            No immediate complications. Estimated Blood Loss:     Estimated blood loss was minimal. Impression:               - Two 3 to 6 mm polyps in the sigmoid colon and in                            the transverse colon, removed with a cold snare.                            Resected and retrieved.                           - Mild diverticulosis in the sigmoid colon, in the                            descending colon and in the transverse colon.                           - Non-bleeding internal hemorrhoids. Recommendation:           - Patient has a contact number available for                             emergencies. The signs and symptoms of potential                            delayed complications were discussed with the                            patient. Return to normal activities tomorrow.                            Written discharge instructions were provided to the                            patient.                           - Resume previous diet.                           - Continue present medications.                           - Await pathology results.                           -  Repeat colonoscopy in 5-10 years for surveillance                            based on pathology results. Mauri Pole, MD 01/27/2019 2:11:02 PM This report has been signed electronically.

## 2019-01-27 NOTE — Patient Instructions (Signed)
Please read handouts provided. Continue present medications. Await pathology results.        YOU HAD AN ENDOSCOPIC PROCEDURE TODAY AT THE [facility]:   Refer to the procedure report that was given to you for any specific questions about what was found during the examination.  If the procedure report does not answer your questions, please call your gastroenterologist to clarify.  If you requested that your care partner not be given the details of your procedure findings, then the procedure report has been included in a sealed envelope for you to review at your convenience later.  YOU SHOULD EXPECT: Some feelings of bloating in the abdomen. Passage of more gas than usual.  Walking can help get rid of the air that was put into your GI tract during the procedure and reduce the bloating. If you had a lower endoscopy (such as a colonoscopy or flexible sigmoidoscopy) you may notice spotting of blood in your stool or on the toilet paper. If you underwent a bowel prep for your procedure, you may not have a normal bowel movement for a few days.  Please Note:  You might notice some irritation and congestion in your nose or some drainage.  This is from the oxygen used during your procedure.  There is no need for concern and it should clear up in a day or so.  SYMPTOMS TO REPORT IMMEDIATELY:   Following lower endoscopy (colonoscopy or flexible sigmoidoscopy):  Excessive amounts of blood in the stool  Significant tenderness or worsening of abdominal pains  Swelling of the abdomen that is new, acute  Fever of 100F or higher    For urgent or emergent issues, a gastroenterologist can be reached at any hour by calling [phone].   DIET:  We do recommend a small meal at first, but then you may proceed to your regular diet.  Drink plenty of fluids but you should avoid alcoholic beverages for 24 hours.  ACTIVITY:  You should plan to take it easy for the rest of today and you should NOT  DRIVE or use heavy machinery until tomorrow (because of the sedation medicines used during the test).    FOLLOW UP: Our staff will call the number listed on your records 48-72 hours following your procedure to check on you and address any questions or concerns that you may have regarding the information given to you following your procedure. If we do not reach you, we will leave a message.  We will attempt to reach you two times.  During this call, we will ask if you have developed any symptoms of COVID 19. If you develop any symptoms (ie: fever, flu-like symptoms, shortness of breath, cough etc.) before then, please call [phone].  If you test positive for Covid 19 in the 2 weeks post procedure, please call and report this information to us.    If any biopsies were taken you will be contacted by phone or by letter within the next 1-3 weeks.  Please call us at [phone] if you have not heard about the biopsies in 3 weeks.    SIGNATURES/CONFIDENTIALITY: You and/or your care partner have signed paperwork which will be entered into your electronic medical record.  These signatures attest to the fact that that the information above on your After Visit Summary has been reviewed and is understood.  Full responsibility of the confidentiality of this discharge information lies with you and/or your care-partner. 

## 2019-01-27 NOTE — Progress Notes (Signed)
Pt's states no medical or surgical changes since previsit or office visit. 

## 2019-01-29 ENCOUNTER — Telehealth: Payer: Self-pay

## 2019-01-29 ENCOUNTER — Telehealth: Payer: Self-pay | Admitting: *Deleted

## 2019-01-29 NOTE — Telephone Encounter (Signed)
  Follow up Call-  Call back number 01/27/2019  Post procedure Call Back phone  # [phone]  Permission to leave phone message Yes  Some recent data might be hidden    [facility]

## 2019-01-29 NOTE — Telephone Encounter (Signed)
  Follow up Call-  Call back number 01/27/2019  Post procedure Call Back phone  # [phone]  Permission to leave phone message Yes  Some recent data might be hidden     Patient questions:  Do you have a fever, pain , or abdominal swelling? No. Pain Score  0 *  Have you tolerated food without any problems? Yes.    Have you been able to return to your normal activities? Yes.    Do you have any questions about your discharge instructions: Diet   No. Medications  No. Follow up visit  No.  Do you have questions or concerns about your Care? No.  Actions: * If pain score is 4 or above: No action needed, pain <4.  1. Have you developed a fever since your procedure? no  2.   Have you had an respiratory symptoms (SOB or cough) since your procedure? no  3.   Have you tested positive for COVID 19 since your procedure no  4.   Have you had any family members/close contacts diagnosed with the COVID 19 since your procedure?  no   If yes to any of these questions please route to Joylene John, RN and Alphonsa Gin, Therapist, sports.

## 2019-02-06 ENCOUNTER — Encounter: Payer: Self-pay | Admitting: Gastroenterology

## 2019-02-13 DIAGNOSIS — Z23 Encounter for immunization: Secondary | ICD-10-CM | POA: Diagnosis not present

## 2019-02-19 DIAGNOSIS — D225 Melanocytic nevi of trunk: Secondary | ICD-10-CM | POA: Diagnosis not present

## 2019-02-19 DIAGNOSIS — D2230 Melanocytic nevi of unspecified part of face: Secondary | ICD-10-CM | POA: Diagnosis not present

## 2019-02-19 DIAGNOSIS — D2361 Other benign neoplasm of skin of right upper limb, including shoulder: Secondary | ICD-10-CM | POA: Diagnosis not present

## 2019-02-19 DIAGNOSIS — Z23 Encounter for immunization: Secondary | ICD-10-CM | POA: Diagnosis not present

## 2019-03-11 ENCOUNTER — Other Ambulatory Visit: Payer: Self-pay

## 2019-03-11 DIAGNOSIS — Z20822 Contact with and (suspected) exposure to covid-19: Secondary | ICD-10-CM

## 2019-03-12 LAB — NOVEL CORONAVIRUS, NAA: SARS-CoV-2, NAA: NOT DETECTED

## 2019-06-09 DIAGNOSIS — Z20828 Contact with and (suspected) exposure to other viral communicable diseases: Secondary | ICD-10-CM | POA: Diagnosis not present

## 2019-06-15 ENCOUNTER — Other Ambulatory Visit: Payer: 59

## 2019-12-23 ENCOUNTER — Other Ambulatory Visit: Payer: Self-pay

## 2019-12-23 ENCOUNTER — Encounter: Payer: Self-pay | Admitting: Nurse Practitioner

## 2019-12-23 ENCOUNTER — Ambulatory Visit (INDEPENDENT_AMBULATORY_CARE_PROVIDER_SITE_OTHER): Payer: 59 | Admitting: Nurse Practitioner

## 2019-12-23 VITALS — BP 116/76

## 2019-12-23 DIAGNOSIS — N3001 Acute cystitis with hematuria: Secondary | ICD-10-CM | POA: Diagnosis not present

## 2019-12-23 DIAGNOSIS — R300 Dysuria: Secondary | ICD-10-CM | POA: Diagnosis not present

## 2019-12-23 MED ORDER — SULFAMETHOXAZOLE-TRIMETHOPRIM 800-160 MG PO TABS: 1.0000 | ORAL_TABLET | Freq: Two times a day (BID) | ORAL | 0 refills | Status: AC

## 2019-12-23 NOTE — Progress Notes (Signed)
   Acute Office Visit  Subjective:    Patient ID: [name], female    DOB: 07/10/1965, 54 y.o.   MRN: [id number]   HPI 54 y.o. presents today for dysuria, frequency, urgency, and hematuria that began 2 days ago and has progressively worsened. Has taken 2 dose of pyridium and increased water intake.    Review of Systems  Constitutional: Negative.   Gastrointestinal: Negative.   Genitourinary: Positive for dysuria, frequency, hematuria and urgency.       Objective:    Physical Exam Constitutional:      Appearance: Normal appearance.  Abdominal:     Tenderness: There is no right CVA tenderness or left CVA tenderness.   GU: deferred  BP 116/76   LMP 01/24/2017 Comment: BTL Wt Readings from Last 3 Encounters:  01/27/19 127 lb (57.6 kg)  01/13/19 127 lb 6.4 oz (57.8 kg)  12/09/18 124 lb (56.2 kg)   UA: Nitrite positive, 2+ blood, negative leukocytes, wbc 6-10, rbc 3-10, few bacteria     Assessment & Plan:   Problem List Items Addressed This Visit    None    Visit Diagnoses    Acute cystitis with hematuria    -  Primary   Relevant Medications   sulfamethoxazole-trimethoprim (BACTRIM DS) 800-160 MG tablet   Dysuria       Relevant Orders   Urinalysis,Complete w/RFL Culture     Plan: Blood and nitrites in urine. Will treat with Bactrim 800-160 mg twice a day for 5 days. Culture pending. Instructed to complete full course of antibiotic and increase water intake. May continue Pyridium but not to exceed 3 days. Return if symptoms worsen or do not improve. She is agreeable to plan.      Tamela Gammon [facility], 12:25 PM 12/23/2019

## 2019-12-26 LAB — URINE CULTURE
MICRO NUMBER:: 10841973
SPECIMEN QUALITY:: ADEQUATE

## 2019-12-26 LAB — URINALYSIS, COMPLETE W/RFL CULTURE
Bilirubin Urine: NEGATIVE
Glucose, UA: NEGATIVE
Hyaline Cast: NONE SEEN /LPF
Ketones, ur: NEGATIVE
Leukocyte Esterase: NEGATIVE
Nitrites, Initial: POSITIVE — AB
Protein, ur: NEGATIVE
Specific Gravity, Urine: 1.01 (ref 1.001–1.03)
pH: 6 (ref 5.0–8.0)

## 2019-12-26 LAB — CULTURE INDICATED

## 2019-12-31 ENCOUNTER — Telehealth: Payer: Self-pay | Admitting: *Deleted

## 2019-12-31 MED ORDER — FLUCONAZOLE 150 MG PO TABS: 150.0000 mg | ORAL_TABLET | Freq: Once | ORAL | 0 refills | Status: AC

## 2019-12-31 NOTE — Telephone Encounter (Signed)
Please send in 1 time dose of Diflucan. She most likely has a yeast infection from being on the antibiotic. Thank you

## 2019-12-31 NOTE — Telephone Encounter (Signed)
Patient informed, Rx sent.  

## 2019-12-31 NOTE — Telephone Encounter (Signed)
Patient called completed Bactrim on Sunday from [place] on 12/23/19, reports vaginal itching and noticed pink spotting when wiping, yesterday darker blood. No blood today,UTI symptoms have all resolved. Please advise

## 2020-02-04 DIAGNOSIS — R69 Illness, unspecified: Secondary | ICD-10-CM | POA: Diagnosis not present

## 2020-02-04 DIAGNOSIS — N951 Menopausal and female climacteric states: Secondary | ICD-10-CM | POA: Diagnosis not present

## 2020-02-04 DIAGNOSIS — Z7189 Other specified counseling: Secondary | ICD-10-CM | POA: Diagnosis not present

## 2020-02-04 DIAGNOSIS — E782 Mixed hyperlipidemia: Secondary | ICD-10-CM | POA: Diagnosis not present

## 2020-02-16 DIAGNOSIS — Z0000 Encounter for general adult medical examination without abnormal findings: Secondary | ICD-10-CM | POA: Diagnosis not present

## 2020-02-16 DIAGNOSIS — Z1159 Encounter for screening for other viral diseases: Secondary | ICD-10-CM | POA: Diagnosis not present

## 2020-02-16 DIAGNOSIS — E559 Vitamin D deficiency, unspecified: Secondary | ICD-10-CM | POA: Diagnosis not present

## 2020-02-24 DIAGNOSIS — Z0000 Encounter for general adult medical examination without abnormal findings: Secondary | ICD-10-CM | POA: Diagnosis not present

## 2020-02-24 DIAGNOSIS — Z23 Encounter for immunization: Secondary | ICD-10-CM | POA: Diagnosis not present

## 2020-03-08 DIAGNOSIS — Z1211 Encounter for screening for malignant neoplasm of colon: Secondary | ICD-10-CM | POA: Diagnosis not present

## 2020-03-22 ENCOUNTER — Other Ambulatory Visit: Payer: Self-pay

## 2020-03-22 ENCOUNTER — Ambulatory Visit (INDEPENDENT_AMBULATORY_CARE_PROVIDER_SITE_OTHER): Payer: 59 | Admitting: Sports Medicine

## 2020-03-22 VITALS — BP 100/64 | Ht 63.5 in | Wt 130.0 lb

## 2020-03-22 DIAGNOSIS — M25512 Pain in left shoulder: Secondary | ICD-10-CM

## 2020-03-22 DIAGNOSIS — G8929 Other chronic pain: Secondary | ICD-10-CM | POA: Diagnosis not present

## 2020-03-22 NOTE — Progress Notes (Signed)
[name] is a 54 y.o. female who presents to [facility] today for the following:  Left Shoulder Pain Anterior that radiates down the forearm Insidious onset, no known injury Started in August She notes that she has been doing yoga for years, but in August started a new program at a new place She reports that the pain has gotten progressively worse States that now it hurts with small movements, where before it would only hurt with large movements occasionally She reports that the pain is a burning and sharp in origin, denies numbness and tingling States that reaching backwards while putting on a jacket makes the pain worse, does not have pain with overhead activity States that she occasionally has pain when trying to lift groceries and 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  ROS as above. Medications reviewed.  Exam:  BP 100/64   Ht 5' 3.5" (1.613 m)   Wt 130 lb (59 kg)   LMP 01/24/2017 Comment: BTL  BMI 22.67 kg/m  Gen: Well NAD MSK:  Left shoulder: Inspection reveals no obvious deformity, atrophy.  Left shoulder is held more superior than right with increased tonicity of left trapezius.  No bruising. No swelling bilaterally Palpation is normal with no TTP over [facility] joint or bicipital groove bilaterally. Full ROM in flexion, abduction, internal/external rotation bilaterally NV intact  distally bilaterally Normal scapular function observed. Special Tests:  - Impingement: Positive Hawkins on left.  Negative empty can bilaterally. - Supraspinatous: Negative empty can bilaterally.  - Infraspinatous/Teres Minor: 5/5 strength with ER bilaterally, pain on left with strength testing. - Subscapularis:5/5 strength with IR bilaterally - Biceps tendon: Negative Speeds bilaterally - Labrum: Negative Obriens bilaterally, negative clunk, good stability - AC Joint: Positive crossarm on left - Negative apprehension test - No painful arc and no drop arm sign   ULTRASOUND: Shoulder, Left  Diagnostic limited ultrasound imaging obtained of patient's left shoulder.  - No obvious evidence of bony deformity or osteophyte development appreciated.  - Long head of the biceps tendon: No evidence of tendon thickening, calcification, subluxation, or tearing in short or long axis views. No edema or bullseye sign.  - Subscapularis tendon: complete visualization across the width of the insertion point yielded no evidence of tendon thickening, calcification, or tears in the long axis view.  - Supraspinatus tendon: complete visualization across the width of the insertion point yielded no evidence of tendon thickening, calcification, or tears in the long axis view. No evidence of bursal inflammation appreciated.  - Infraspinatus and teres minor tendons: visualization across the width of the insertion points yielded no evidence of tendon thickening, calcification, or tears in the long axis view.  [facility] Joint: No evidence of joint separation, collapse, or osteophyte development appreciated. Moderate effusion present.  IMPRESSION: findings consistent with no rotator cuff tear, notable effusion at [facility].  Assessment and Plan: 1) Chronic left shoulder pain Patient's exam consistent with rotator cuff impingement.  No obvious signs of biceps tendon pathology on exam or on ultrasound.  Ultrasound shows intact  rotator cuff musculature and she continues to have full strength with rotator cuff testing on exam.  We will treat her with physical therapy, she can use ice and ibuprofen as needed.  She can return for follow-up in 4 weeks.  She does not seem to presently have any symptoms of labral pathology, but given the location and history of her pain, could consider further imaging in the future to rule out a labral tear should she not improve.   [place] Constable, D.O.  PGY-3 Family Medicine  03/22/2020 4:04 PM   Patient seen and evaluated with the resident.  I agree with the above plan of care.  Ultrasound shows no obvious rotator cuff tear.  We will try physical therapy and she will follow up with me in 4 weeks.  If symptoms persist at follow-up, consider merits of further diagnostic imaging.

## 2020-03-22 NOTE — Assessment & Plan Note (Signed)
Patient's exam consistent with rotator cuff impingement.  No obvious signs of biceps tendon pathology on exam or on ultrasound.  Ultrasound shows intact rotator cuff musculature and she continues to have full strength with rotator cuff testing on exam.  We will treat her with physical therapy, she can use ice and ibuprofen as needed.  She can return for follow-up in 4 weeks.  She does not seem to presently have any symptoms of labral pathology, but given the location and history of her pain, could consider further imaging in the future to rule out a labral tear should she not improve.

## 2020-03-22 NOTE — Patient Instructions (Signed)
You have rotator cuff impingement, but do not appear to have a tear.  You should do physical therapy and avoid motions that make the pain worse.  You can ice and use ibuprofen as needed for your pain.  Come back in 4 weeks for follow up.

## 2020-04-07 DIAGNOSIS — Z118 Encounter for screening for other infectious and parasitic diseases: Secondary | ICD-10-CM | POA: Diagnosis not present

## 2020-04-07 DIAGNOSIS — M25512 Pain in left shoulder: Secondary | ICD-10-CM | POA: Diagnosis not present

## 2020-04-07 DIAGNOSIS — Z01419 Encounter for gynecological examination (general) (routine) without abnormal findings: Secondary | ICD-10-CM | POA: Diagnosis not present

## 2020-04-07 DIAGNOSIS — Z23 Encounter for immunization: Secondary | ICD-10-CM | POA: Diagnosis not present

## 2020-04-11 ENCOUNTER — Other Ambulatory Visit: Payer: Self-pay | Admitting: Family Medicine

## 2020-04-11 DIAGNOSIS — Z1231 Encounter for screening mammogram for malignant neoplasm of breast: Secondary | ICD-10-CM

## 2020-05-20 ENCOUNTER — Ambulatory Visit: Payer: 59

## 2020-06-15 ENCOUNTER — Ambulatory Visit: Payer: 59

## 2020-07-28 ENCOUNTER — Other Ambulatory Visit: Payer: Self-pay

## 2020-07-28 ENCOUNTER — Ambulatory Visit
Admission: RE | Admit: 2020-07-28 | Discharge: 2020-07-28 | Disposition: A | Discharge status: Home / Self Care | Payer: 59 | Source: Ambulatory Visit | Attending: Family Medicine | Admitting: Family Medicine

## 2020-07-28 DIAGNOSIS — Z1231 Encounter for screening mammogram for malignant neoplasm of breast: Secondary | ICD-10-CM

## 2021-07-03 ENCOUNTER — Other Ambulatory Visit: Payer: Self-pay | Admitting: Internal Medicine

## 2021-07-03 ENCOUNTER — Other Ambulatory Visit: Payer: Self-pay | Admitting: Family Medicine

## 2021-07-03 DIAGNOSIS — Z1231 Encounter for screening mammogram for malignant neoplasm of breast: Secondary | ICD-10-CM

## 2021-08-03 ENCOUNTER — Ambulatory Visit
Admission: RE | Admit: 2021-08-03 | Discharge: 2021-08-03 | Disposition: A | Discharge status: Home / Self Care | Payer: Managed Care, Other (non HMO) | Source: Ambulatory Visit | Attending: Internal Medicine | Admitting: Internal Medicine

## 2021-08-03 DIAGNOSIS — Z1231 Encounter for screening mammogram for malignant neoplasm of breast: Secondary | ICD-10-CM

## 2021-08-04 ENCOUNTER — Other Ambulatory Visit: Payer: Self-pay | Admitting: Internal Medicine

## 2021-08-04 DIAGNOSIS — R928 Other abnormal and inconclusive findings on diagnostic imaging of breast: Secondary | ICD-10-CM

## 2021-08-23 ENCOUNTER — Ambulatory Visit
Admission: RE | Admit: 2021-08-23 | Discharge: 2021-08-23 | Disposition: A | Discharge status: Home / Self Care | Payer: BC Managed Care – PPO | Source: Ambulatory Visit | Attending: Internal Medicine | Admitting: Internal Medicine

## 2021-08-23 ENCOUNTER — Ambulatory Visit: Payer: Managed Care, Other (non HMO)

## 2021-08-23 DIAGNOSIS — R928 Other abnormal and inconclusive findings on diagnostic imaging of breast: Secondary | ICD-10-CM

## 2021-08-23 DIAGNOSIS — R921 Mammographic calcification found on diagnostic imaging of breast: Secondary | ICD-10-CM | POA: Diagnosis not present

## 2021-12-05 ENCOUNTER — Telehealth: Payer: Self-pay | Admitting: Gastroenterology

## 2021-12-05 NOTE — Telephone Encounter (Signed)
Spoke with the patient. She has had constipation for 2 to 3 weeks. No diet changes. No medication changes (not on any medications.) She began taking Dulcolax stool softener. If she takes this she will have soft mushy stools. If she stops it, she will have hard large stools she is having difficulty passing. Treating her hemorrhoid with OTC Prep H.  She will try daily Miralax and titrate to her response. Appointment scheduled with Amy Esterwood 01/02/22 at 8:30 am.

## 2021-12-05 NOTE — Telephone Encounter (Signed)
Inbound call from patient stating that she has had a change in bowel habits, and cant seem to get it back on track. Patient is having issues with constipation and hemorrhoids. Patient is requesting a call back, please advise.

## 2022-01-02 ENCOUNTER — Ambulatory Visit: Payer: BC Managed Care – PPO | Admitting: Physician Assistant

## 2022-01-16 ENCOUNTER — Other Ambulatory Visit: Payer: Self-pay | Admitting: Internal Medicine

## 2022-01-16 DIAGNOSIS — R928 Other abnormal and inconclusive findings on diagnostic imaging of breast: Secondary | ICD-10-CM

## 2022-02-16 DIAGNOSIS — Z23 Encounter for immunization: Secondary | ICD-10-CM | POA: Diagnosis not present

## 2022-03-01 ENCOUNTER — Other Ambulatory Visit: Payer: Self-pay | Admitting: Internal Medicine

## 2022-03-01 ENCOUNTER — Ambulatory Visit
Admission: RE | Admit: 2022-03-01 | Discharge: 2022-03-01 | Disposition: A | Discharge status: Home / Self Care | Payer: BC Managed Care – PPO | Source: Ambulatory Visit | Attending: Internal Medicine | Admitting: Internal Medicine

## 2022-03-01 DIAGNOSIS — R921 Mammographic calcification found on diagnostic imaging of breast: Secondary | ICD-10-CM | POA: Diagnosis not present

## 2022-03-01 DIAGNOSIS — R928 Other abnormal and inconclusive findings on diagnostic imaging of breast: Secondary | ICD-10-CM

## 2022-03-01 DIAGNOSIS — N6310 Unspecified lump in the right breast, unspecified quadrant: Secondary | ICD-10-CM

## 2022-04-06 DIAGNOSIS — E785 Hyperlipidemia, unspecified: Secondary | ICD-10-CM | POA: Diagnosis not present

## 2022-04-06 DIAGNOSIS — R7989 Other specified abnormal findings of blood chemistry: Secondary | ICD-10-CM | POA: Diagnosis not present

## 2022-04-12 DIAGNOSIS — Z1339 Encounter for screening examination for other mental health and behavioral disorders: Secondary | ICD-10-CM | POA: Diagnosis not present

## 2022-04-12 DIAGNOSIS — Z0000 Encounter for general adult medical examination without abnormal findings: Secondary | ICD-10-CM | POA: Diagnosis not present

## 2022-04-12 DIAGNOSIS — Z1331 Encounter for screening for depression: Secondary | ICD-10-CM | POA: Diagnosis not present

## 2022-07-05 DIAGNOSIS — Z1389 Encounter for screening for other disorder: Secondary | ICD-10-CM | POA: Diagnosis not present

## 2022-07-05 DIAGNOSIS — Z01419 Encounter for gynecological examination (general) (routine) without abnormal findings: Secondary | ICD-10-CM | POA: Diagnosis not present

## 2022-08-16 ENCOUNTER — Ambulatory Visit
Admission: RE | Admit: 2022-08-16 | Discharge: 2022-08-16 | Disposition: A | Discharge status: Home / Self Care | Payer: BC Managed Care – PPO | Source: Ambulatory Visit | Attending: Internal Medicine | Admitting: Internal Medicine

## 2022-08-16 DIAGNOSIS — R921 Mammographic calcification found on diagnostic imaging of breast: Secondary | ICD-10-CM | POA: Diagnosis not present

## 2022-08-30 DIAGNOSIS — D2230 Melanocytic nevi of unspecified part of face: Secondary | ICD-10-CM | POA: Diagnosis not present

## 2022-08-30 DIAGNOSIS — L570 Actinic keratosis: Secondary | ICD-10-CM | POA: Diagnosis not present

## 2022-08-30 DIAGNOSIS — M79671 Pain in right foot: Secondary | ICD-10-CM | POA: Diagnosis not present

## 2022-08-30 DIAGNOSIS — D225 Melanocytic nevi of trunk: Secondary | ICD-10-CM | POA: Diagnosis not present

## 2022-08-30 DIAGNOSIS — D2361 Other benign neoplasm of skin of right upper limb, including shoulder: Secondary | ICD-10-CM | POA: Diagnosis not present

## 2023-01-16 DIAGNOSIS — L814 Other melanin hyperpigmentation: Secondary | ICD-10-CM | POA: Diagnosis not present

## 2023-01-30 DIAGNOSIS — Z23 Encounter for immunization: Secondary | ICD-10-CM | POA: Diagnosis not present

## 2023-04-12 DIAGNOSIS — E785 Hyperlipidemia, unspecified: Secondary | ICD-10-CM | POA: Diagnosis not present

## 2023-04-18 DIAGNOSIS — Z1339 Encounter for screening examination for other mental health and behavioral disorders: Secondary | ICD-10-CM | POA: Diagnosis not present

## 2023-04-18 DIAGNOSIS — Z1331 Encounter for screening for depression: Secondary | ICD-10-CM | POA: Diagnosis not present

## 2023-04-18 DIAGNOSIS — Z0000 Encounter for general adult medical examination without abnormal findings: Secondary | ICD-10-CM | POA: Diagnosis not present

## 2023-06-07 DIAGNOSIS — K769 Liver disease, unspecified: Secondary | ICD-10-CM | POA: Diagnosis not present

## 2023-09-11 IMAGING — MG MM DIGITAL DIAGNOSTIC UNILAT*R* W/ TOMO W/ CAD
8 series · 8 of 20 positions shown · non-contrast
Comparison: Previous exam(s).

CLINICAL DATA: Recall from screening mammography, possible
mass/asymmetry involving the retroareolar RIGHT breast at posterior
depth and a separate group of calcifications in the central RIGHT
breast.

EXAM:
DIGITAL DIAGNOSTIC UNILATERAL RIGHT MAMMOGRAM WITH TOMOSYNTHESIS AND
CAD
TECHNIQUE: Right digital diagnostic mammography and breast tomosynthesis was
performed. The images were evaluated with computer-aided detection.

[R ML]
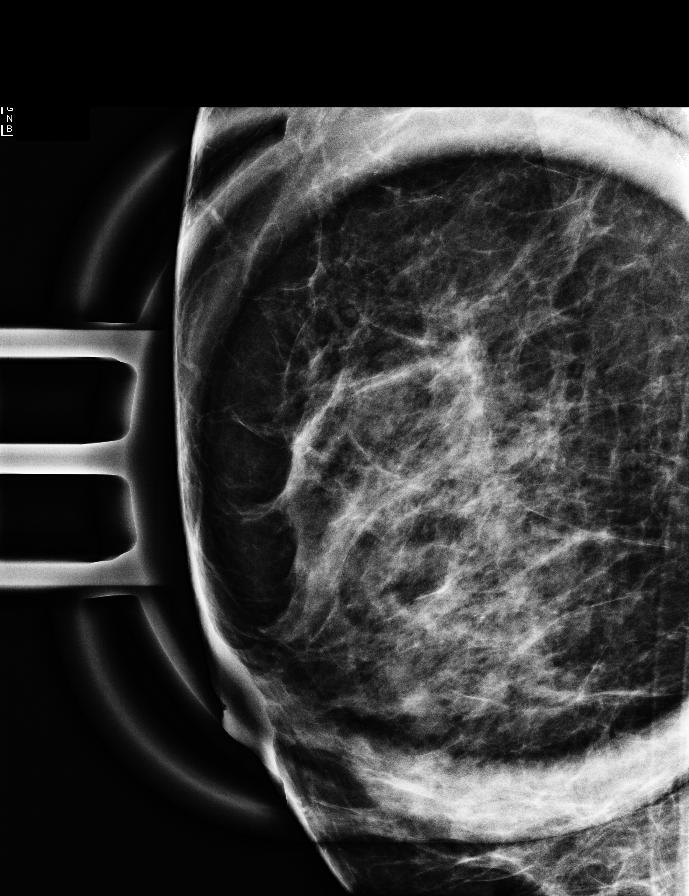

[R CC]
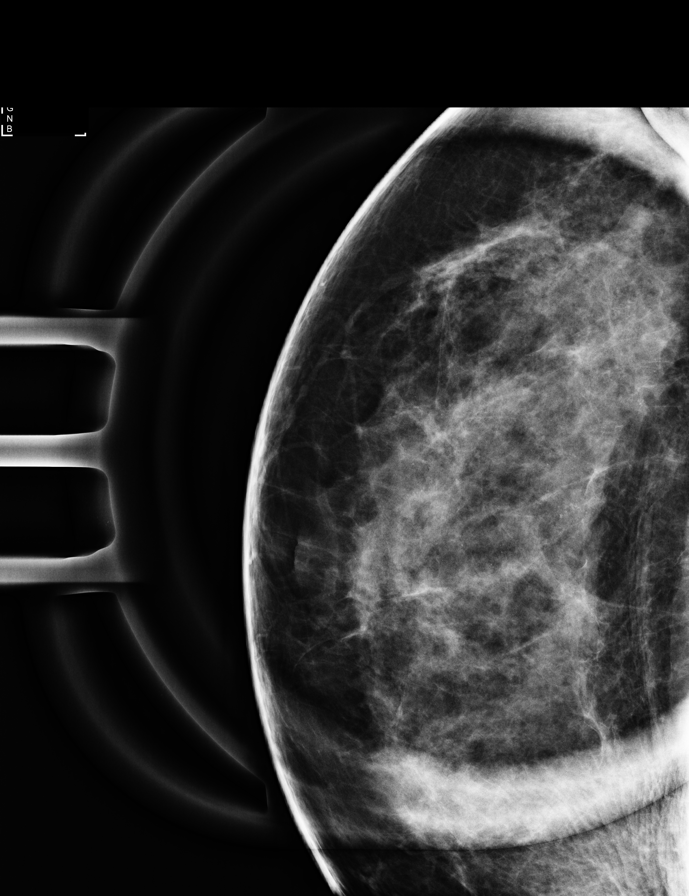

[R MLO synth-2D]
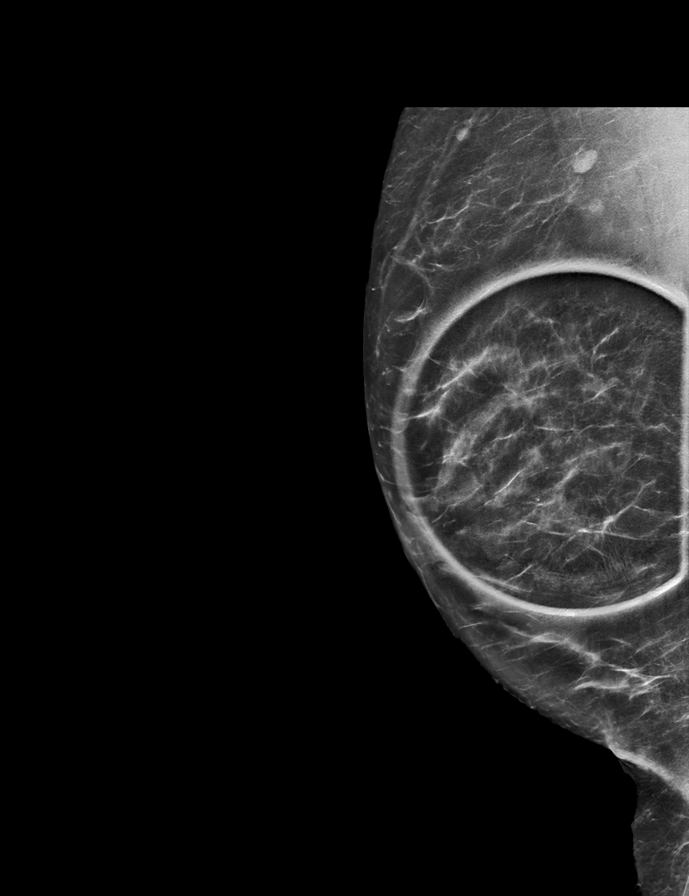

[R ML synth-2D]
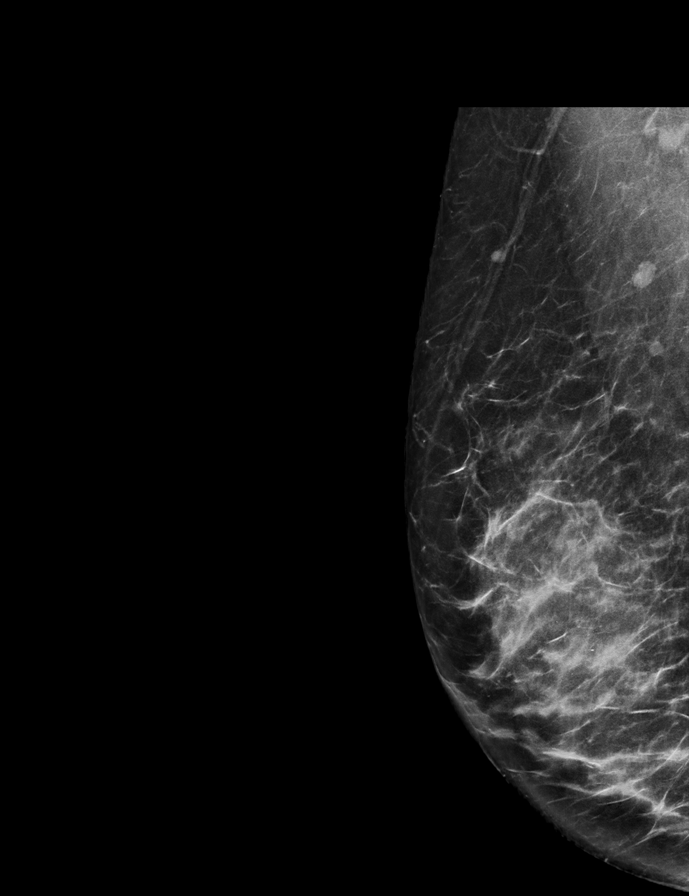

[R CC synth-2D]
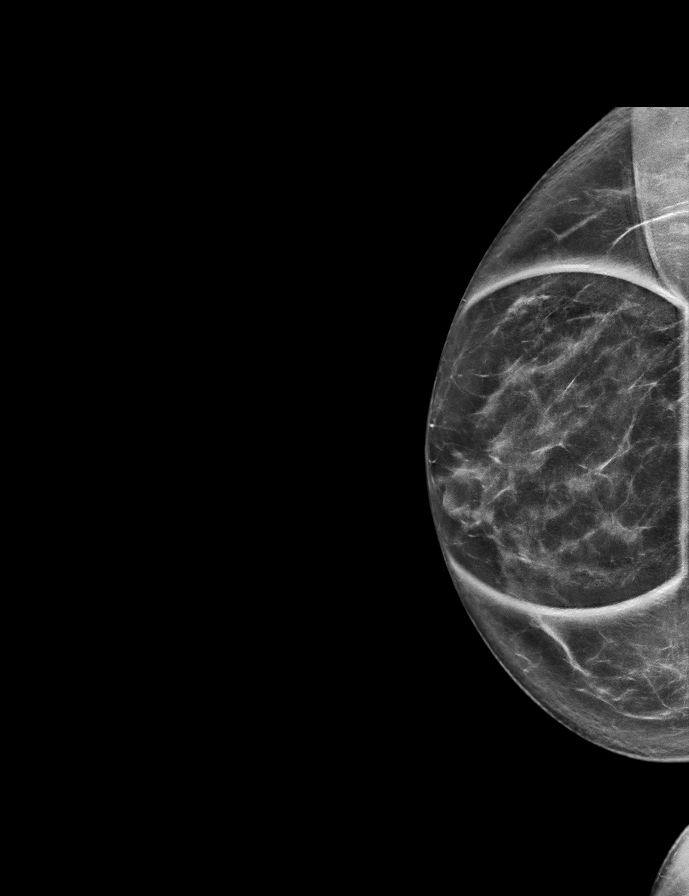

[R CC tomo · tomo slice 36/71.0]
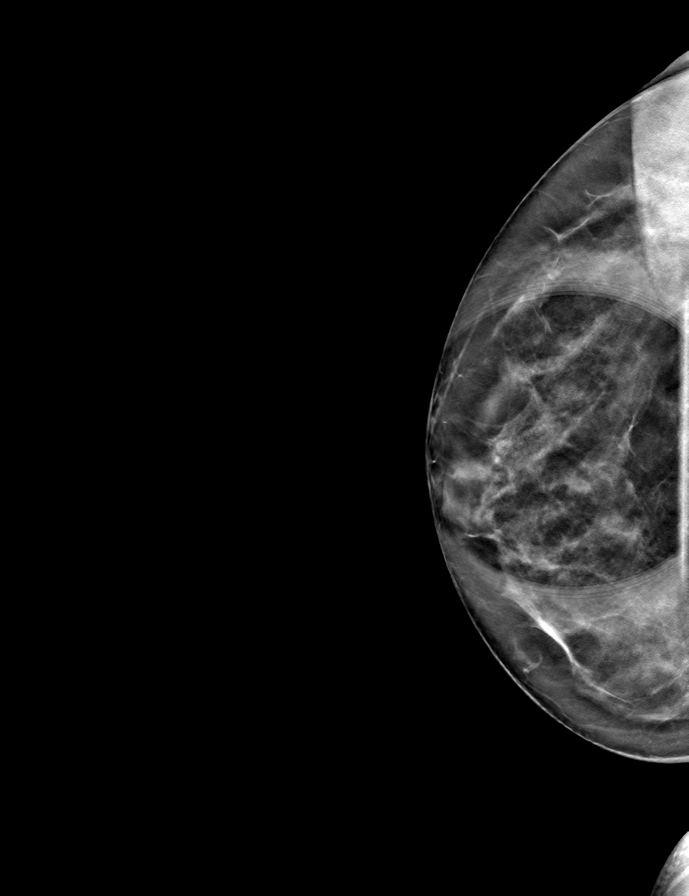

[R MLO tomo · tomo slice 35/70.0]
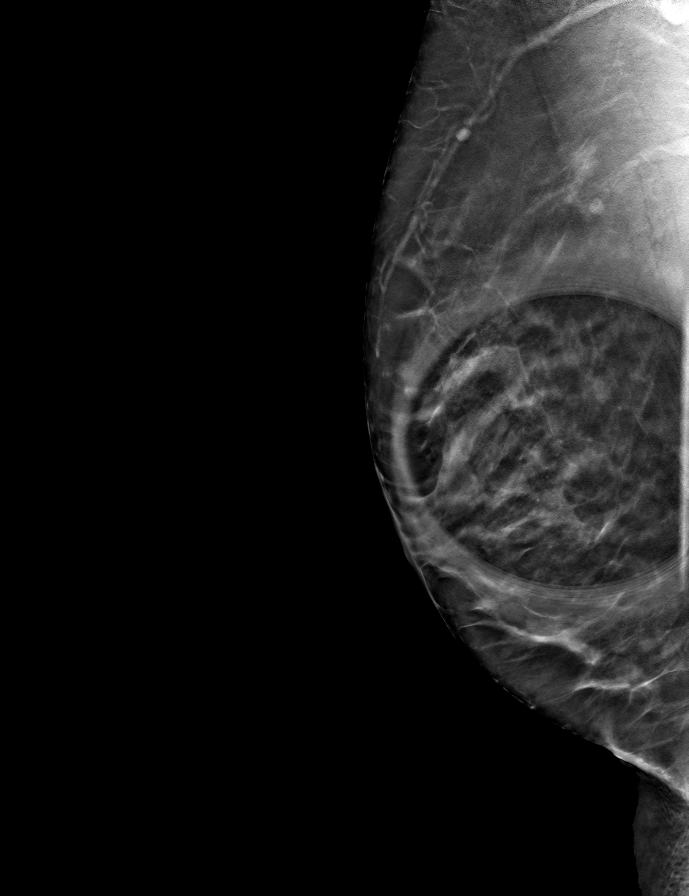

[R ML tomo · tomo slice 31/62.0]
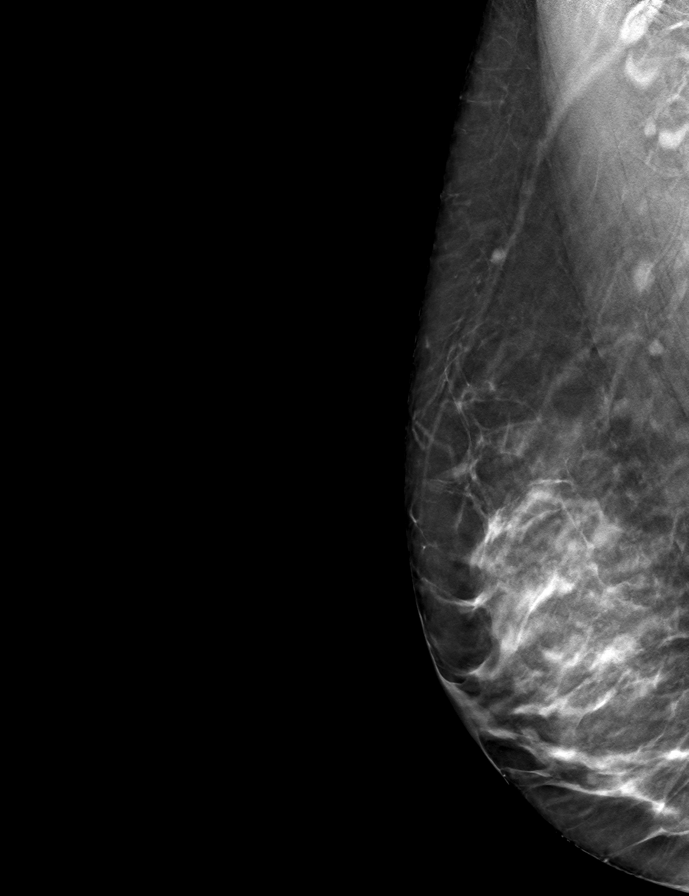

[8 of 20 positions shown; findings below may reference images not displayed]

ACR Breast Density Category c: The breast tissue is heterogeneously
dense, which may obscure small masses.
FINDINGS: Spot-compression CC and MLO views of the possible mass/asymmetry,
spot magnification CC and mediolateral views of the calcifications
and a full field mediolateral view were obtained.

The mass/asymmetry in the retroareolar breast at posterior depth
questioned on screening mammography disperses with compression,
indicating overlapping fibroglandular tissue. There is no underlying
mass or architectural distortion.

Spot magnification views confirm loosely grouped faint
calcifications spanning approximately 0.9 cm which are punctate and
amorphous in morphology on the CC view, many of which demonstrate
layering on the mediolateral view. There are no suspicious linear or
branching forms.
IMPRESSION: Likely benign 0.9 cm group of calcifications, many of which
demonstrate layering indicating benign milk of calcium.

RECOMMENDATION:
Diagnostic RIGHT mammogram in 6 months to include spot magnification
views of the calcifications.

I have discussed the findings and recommendations with the patient.
If applicable, a reminder letter will be sent to the patient
regarding the next appointment.

BI-RADS CATEGORY  3: Probably benign.

## 2023-09-17 ENCOUNTER — Other Ambulatory Visit: Payer: Self-pay | Admitting: Internal Medicine

## 2023-09-17 DIAGNOSIS — R921 Mammographic calcification found on diagnostic imaging of breast: Secondary | ICD-10-CM

## 2023-10-11 ENCOUNTER — Ambulatory Visit
Admission: RE | Admit: 2023-10-11 | Discharge: 2023-10-11 | Disposition: A | Discharge status: Home / Self Care | Source: Ambulatory Visit | Attending: Internal Medicine | Admitting: Internal Medicine

## 2023-10-11 DIAGNOSIS — R921 Mammographic calcification found on diagnostic imaging of breast: Secondary | ICD-10-CM
